# Patient Record
Sex: Female | Born: 1970
Health system: Southern US, Community
[De-identification: ages and names within clinical notes are randomized; demographics above are authoritative.]

## PROBLEM LIST (undated history)

## (undated) DIAGNOSIS — J45909 Unspecified asthma, uncomplicated: Secondary | ICD-10-CM

## (undated) DIAGNOSIS — F419 Anxiety disorder, unspecified: Secondary | ICD-10-CM

## (undated) DIAGNOSIS — F32A Depression, unspecified: Secondary | ICD-10-CM

## (undated) DIAGNOSIS — E559 Vitamin D deficiency, unspecified: Secondary | ICD-10-CM

## (undated) HISTORY — DX: Unspecified asthma, uncomplicated: J45.909

## (undated) HISTORY — PX: COLONOSCOPY: SHX174

## (undated) HISTORY — PX: NO PAST SURGERIES: SHX2092

## (undated) HISTORY — DX: Vitamin D deficiency, unspecified: E55.9

## (undated) HISTORY — DX: Depression, unspecified: F32.A

## (undated) HISTORY — DX: Anxiety disorder, unspecified: F41.9

---

## 1999-01-17 ENCOUNTER — Other Ambulatory Visit: Admission: RE | Admit: 1999-01-17 | Discharge: 1999-01-17 | Payer: Self-pay | Admitting: Obstetrics and Gynecology

## 1999-12-04 ENCOUNTER — Inpatient Hospital Stay (HOSPITAL_COMMUNITY): Admission: AD | Admit: 1999-12-04 | Discharge: 1999-12-06 | Payer: Self-pay | Admitting: Obstetrics and Gynecology

## 1999-12-11 ENCOUNTER — Encounter: Admission: RE | Admit: 1999-12-11 | Discharge: 2000-03-10 | Payer: Self-pay | Admitting: Obstetrics and Gynecology

## 2000-01-17 ENCOUNTER — Other Ambulatory Visit: Admission: RE | Admit: 2000-01-17 | Discharge: 2000-01-17 | Payer: Self-pay | Admitting: Obstetrics and Gynecology

## 2001-03-20 ENCOUNTER — Other Ambulatory Visit: Admission: RE | Admit: 2001-03-20 | Discharge: 2001-03-20 | Payer: Self-pay | Admitting: Obstetrics and Gynecology

## 2002-05-11 ENCOUNTER — Other Ambulatory Visit: Admission: RE | Admit: 2002-05-11 | Discharge: 2002-05-11 | Payer: Self-pay | Admitting: Obstetrics and Gynecology

## 2003-06-22 ENCOUNTER — Other Ambulatory Visit: Admission: RE | Admit: 2003-06-22 | Discharge: 2003-06-22 | Payer: Self-pay | Admitting: Obstetrics and Gynecology

## 2004-11-23 ENCOUNTER — Other Ambulatory Visit: Admission: RE | Admit: 2004-11-23 | Discharge: 2004-11-23 | Payer: Self-pay | Admitting: Obstetrics and Gynecology

## 2005-12-11 ENCOUNTER — Other Ambulatory Visit: Admission: RE | Admit: 2005-12-11 | Discharge: 2005-12-11 | Payer: Self-pay | Admitting: Obstetrics and Gynecology

## 2014-09-23 ENCOUNTER — Encounter (HOSPITAL_BASED_OUTPATIENT_CLINIC_OR_DEPARTMENT_OTHER): Payer: Self-pay

## 2014-09-23 ENCOUNTER — Emergency Department (HOSPITAL_BASED_OUTPATIENT_CLINIC_OR_DEPARTMENT_OTHER)
Admission: EM | Admit: 2014-09-23 | Discharge: 2014-09-24 | Disposition: A | Discharge status: Home / Self Care | Payer: BC Managed Care – PPO | Attending: Emergency Medicine | Admitting: Emergency Medicine

## 2014-09-23 DIAGNOSIS — S61112A Laceration without foreign body of left thumb with damage to nail, initial encounter: Secondary | ICD-10-CM | POA: Diagnosis present

## 2014-09-23 DIAGNOSIS — Z79899 Other long term (current) drug therapy: Secondary | ICD-10-CM | POA: Diagnosis not present

## 2014-09-23 DIAGNOSIS — Y9289 Other specified places as the place of occurrence of the external cause: Secondary | ICD-10-CM | POA: Diagnosis not present

## 2014-09-23 DIAGNOSIS — Y998 Other external cause status: Secondary | ICD-10-CM | POA: Diagnosis not present

## 2014-09-23 DIAGNOSIS — W260XXA Contact with knife, initial encounter: Secondary | ICD-10-CM | POA: Insufficient documentation

## 2014-09-23 DIAGNOSIS — Y93G9 Activity, other involving cooking and grilling: Secondary | ICD-10-CM | POA: Diagnosis not present

## 2014-09-23 DIAGNOSIS — IMO0002 Reserved for concepts with insufficient information to code with codable children: Secondary | ICD-10-CM

## 2014-09-23 NOTE — ED Notes (Addendum)
Cut left thumb with kitchen knife approx 1pm-lac noted along nail bed with bleeding-pt with bandaids and given gauze

## 2014-09-24 ENCOUNTER — Encounter (HOSPITAL_BASED_OUTPATIENT_CLINIC_OR_DEPARTMENT_OTHER): Payer: Self-pay | Admitting: Emergency Medicine

## 2014-09-24 MED ORDER — IBUPROFEN 800 MG PO TABS
800.0000 mg | ORAL_TABLET | Freq: Once | ORAL | Status: AC
  Administered 2014-09-24: 800 mg via ORAL
  Filled 2014-09-24: qty 1

## 2014-09-24 NOTE — ED Provider Notes (Signed)
CSN: [id number]     Arrival date & time 09/23/14  2159 History   First MD Initiated Contact with Patient 09/24/14 0029     Chief Complaint  Patient presents with  . Extremity Laceration     (Consider location/radiation/quality/duration/timing/severity/associated sxs/prior Treatment) Patient is a 43 y.o. female presenting with skin laceration. The history is provided by the patient.  Laceration Location:  Hand Hand laceration location:  L finger Depth:  Cutaneous Quality: jagged   Bleeding: venous and controlled with pressure   Time since incident:  12 hours Laceration mechanism:  Knife Pain details:    Quality:  Aching   Severity:  Moderate   Timing:  Constant   Progression:  Unchanged Foreign body present:  No foreign bodies Relieved by:  Nothing Worsened by:  Nothing tried Ineffective treatments:  None tried Tetanus status:  Up to date   History reviewed. No pertinent past medical history. History reviewed. No pertinent past surgical history. No family history on file. History  Substance Use Topics  . Smoking status: Never Smoker   . Smokeless tobacco: Not on file  . Alcohol Use: No   OB History    No data available     Review of Systems  Skin: Positive for wound.  All other systems reviewed and are negative.     Allergies  Review of patient's allergies indicates no known allergies.  Home Medications   Prior to Admission medications   Medication Sig Start Date End Date Taking? Authorizing Provider  DULoxetine (CYMBALTA) 60 MG capsule Take 60 mg by mouth daily.   Yes Historical Provider, MD   BP 136/86 mmHg  Pulse 76  Temp(Src) 98.9 F (37.2 C) (Oral)  Resp 18  Ht 5\' 6"  (1.676 m)  Wt 190 lb (86.183 kg)  BMI 30.68 kg/m2  SpO2 97%  LMP 09/12/2014 Physical Exam  Constitutional: She is oriented to person, place, and time. She appears well-developed and well-nourished. No distress.  HENT:  Head: Normocephalic and atraumatic.  Eyes: Conjunctivae  and EOM are normal.  Neck: Normal range of motion. Neck supple.  Cardiovascular: Normal rate, regular rhythm and intact distal pulses.   Pulmonary/Chest: Effort normal and breath sounds normal. She has no wheezes.  Abdominal: Soft. Bowel sounds are normal. There is no tenderness.  Musculoskeletal: Normal range of motion.       Left hand: She exhibits normal range of motion, no tenderness, no bony tenderness and normal two-point discrimination. Normal sensation noted. Normal strength noted.       Hands: Neurological: She is alert and oriented to person, place, and time.  Skin: Skin is warm and dry.  Psychiatric: She has a normal mood and affect.    ED Course  Procedures (including critical care time) Labs Review Labs Reviewed - No data to display  Imaging Review No results found.   EKG Interpretation None      MDM   Final diagnoses:  None    LACERATION REPAIR Performed by: Carlisle Beers Authorized by: Carlisle Beers Consent: Verbal consent obtained. Risks and benefits: risks, benefits and alternatives were discussed Consent given by: patient Patient identity confirmed: provided demographic data Prepped and Draped in normal sterile fashion Wound explored  Laceration Location: thumb left at border of nail  Laceration Length:  .9 cm  No Foreign Bodies seen or palpated  Irrigation method: syringe Amount of cleaning: extensive  Skin closure: dermabond   Technique: dermabond  Patient tolerance: Patient tolerated the procedure well with no immediate complications.  Carlisle Beers, MD 09/24/14 [phone]

## 2014-09-24 NOTE — ED Notes (Signed)
Wound naturally approximated well, not displaced, dermabond applied by Dr. Randal Buba. No oozing, bleeding or drainage. Dressed with gauze. Denies questions sx or needs. Out with family.

## 2014-09-24 NOTE — ED Notes (Addendum)
Pt seen by EDP prior to RN assessment, see MD notes, orders received and initiated, pt soaking thumb in betadine. Here for "cut thumb with knife while cutting potatoes". Bleeding controlled. Denies pain. CMS/ROM intact. Td " w/in 5 yrs, 0.5cm lac note to thumb at nail edge.

## 2015-12-12 DIAGNOSIS — Z1231 Encounter for screening mammogram for malignant neoplasm of breast: Secondary | ICD-10-CM | POA: Diagnosis not present

## 2015-12-15 MED FILL — DULoxetine HCL 60 MG CPEP: 60 | 90 days supply | Qty: 90 | Fill #2

## 2016-01-30 DIAGNOSIS — Z01419 Encounter for gynecological examination (general) (routine) without abnormal findings: Secondary | ICD-10-CM | POA: Diagnosis not present

## 2016-01-30 DIAGNOSIS — Z6832 Body mass index (BMI) 32.0-32.9, adult: Secondary | ICD-10-CM | POA: Diagnosis not present

## 2016-03-13 MED FILL — DULoxetine HCL 60 MG CPEP: 60 | 90 days supply | Qty: 90 | Fill #3

## 2016-06-12 MED FILL — DULoxetine HCL 60 MG CPEP: 60 | 60 days supply | Qty: 60 | Fill #4

## 2016-06-26 DIAGNOSIS — Z0000 Encounter for general adult medical examination without abnormal findings: Secondary | ICD-10-CM | POA: Diagnosis not present

## 2016-06-26 DIAGNOSIS — E559 Vitamin D deficiency, unspecified: Secondary | ICD-10-CM | POA: Diagnosis not present

## 2016-06-26 DIAGNOSIS — Z1389 Encounter for screening for other disorder: Secondary | ICD-10-CM | POA: Diagnosis not present

## 2016-06-26 DIAGNOSIS — F411 Generalized anxiety disorder: Secondary | ICD-10-CM | POA: Diagnosis not present

## 2016-06-26 DIAGNOSIS — R7309 Other abnormal glucose: Secondary | ICD-10-CM | POA: Diagnosis not present

## 2016-07-18 ENCOUNTER — Emergency Department (HOSPITAL_COMMUNITY)
Admission: EM | Admit: 2016-07-18 | Discharge: 2016-07-18 | Disposition: A | Discharge status: Home / Self Care | Payer: 59 | Attending: Emergency Medicine | Admitting: Emergency Medicine

## 2016-07-18 ENCOUNTER — Encounter (HOSPITAL_COMMUNITY): Payer: Self-pay | Admitting: *Deleted

## 2016-07-18 ENCOUNTER — Emergency Department (HOSPITAL_COMMUNITY): Payer: 59

## 2016-07-18 DIAGNOSIS — Z79899 Other long term (current) drug therapy: Secondary | ICD-10-CM | POA: Insufficient documentation

## 2016-07-18 DIAGNOSIS — R1084 Generalized abdominal pain: Secondary | ICD-10-CM | POA: Diagnosis not present

## 2016-07-18 DIAGNOSIS — R110 Nausea: Secondary | ICD-10-CM | POA: Insufficient documentation

## 2016-07-18 DIAGNOSIS — R109 Unspecified abdominal pain: Secondary | ICD-10-CM | POA: Diagnosis not present

## 2016-07-18 DIAGNOSIS — Z791 Long term (current) use of non-steroidal anti-inflammatories (NSAID): Secondary | ICD-10-CM | POA: Diagnosis not present

## 2016-07-18 DIAGNOSIS — R102 Pelvic and perineal pain: Secondary | ICD-10-CM | POA: Diagnosis not present

## 2016-07-18 LAB — CBC WITH DIFFERENTIAL/PLATELET
Basophils Absolute: 0 10*3/uL (ref 0.0–0.1)
Basophils Relative: 0 %
Eosinophils Absolute: 0 10*3/uL (ref 0.0–0.7)
Eosinophils Relative: 0 %
HCT: 37.5 % (ref 36.0–46.0)
Hemoglobin: 12.8 g/dL (ref 12.0–15.0)
Lymphocytes Relative: 11 %
Lymphs Abs: 1 10*3/uL (ref 0.7–4.0)
MCH: 30.9 pg (ref 26.0–34.0)
MCHC: 34.1 g/dL (ref 30.0–36.0)
MCV: 90.6 fL (ref 78.0–100.0)
Monocytes Absolute: 0.6 10*3/uL (ref 0.1–1.0)
Monocytes Relative: 7 %
Neutro Abs: 7.5 10*3/uL (ref 1.7–7.7)
Neutrophils Relative %: 82 %
Platelets: 300 10*3/uL (ref 150–400)
RBC: 4.14 MIL/uL (ref 3.87–5.11)
RDW: 13.5 % (ref 11.5–15.5)
WBC: 9.2 10*3/uL (ref 4.0–10.5)

## 2016-07-18 LAB — COMPREHENSIVE METABOLIC PANEL
ALT: 18 U/L (ref 14–54)
AST: 21 U/L (ref 15–41)
Albumin: 4.3 g/dL (ref 3.5–5.0)
Alkaline Phosphatase: 43 U/L (ref 38–126)
Anion gap: 8 (ref 5–15)
BUN: 19 mg/dL (ref 6–20)
CO2: 20 mmol/L — ABNORMAL LOW (ref 22–32)
Calcium: 9.2 mg/dL (ref 8.9–10.3)
Chloride: 106 mmol/L (ref 101–111)
Creatinine, Ser: 0.78 mg/dL (ref 0.44–1.00)
GFR calc Af Amer: >60 mL/min (ref >60)
GFR calc non Af Amer: >60 mL/min (ref >60)
Glucose, Bld: 111 mg/dL — ABNORMAL HIGH (ref 65–99)
Potassium: 3.6 mmol/L (ref 3.5–5.1)
Sodium: 134 mmol/L — ABNORMAL LOW (ref 135–145)
Total Bilirubin: 0.3 mg/dL (ref 0.3–1.2)
Total Protein: 7 g/dL (ref 6.5–8.1)

## 2016-07-18 LAB — URINALYSIS, ROUTINE W REFLEX MICROSCOPIC
Bilirubin Urine: NEGATIVE
Glucose, UA: NEGATIVE mg/dL
Hgb urine dipstick: NEGATIVE
Ketones, ur: NEGATIVE mg/dL
Leukocytes, UA: NEGATIVE
Nitrite: NEGATIVE
Protein, ur: NEGATIVE mg/dL
Specific Gravity, Urine: 1.018 (ref 1.005–1.030)
pH: 8.5 — ABNORMAL HIGH (ref 5.0–8.0)

## 2016-07-18 LAB — LIPASE, BLOOD: Lipase: 30 U/L (ref 11–51)

## 2016-07-18 LAB — I-STAT CG4 LACTIC ACID, ED: Lactic Acid, Venous: 2.02 mmol/L (ref 0.5–1.9)

## 2016-07-18 LAB — POC URINE PREG, ED: Preg Test, Ur: NEGATIVE

## 2016-07-18 MED ORDER — SODIUM CHLORIDE 0.9 % IV BOLUS (SEPSIS)
1000.0000 mL | Freq: Once | INTRAVENOUS | Status: AC
Start: 2016-07-18 — End: 2016-07-18
  Administered 2016-07-18: 1000 mL via INTRAVENOUS

## 2016-07-18 MED ORDER — ONDANSETRON HCL 4 MG PO TABS: 4.0000 mg | ORAL_TABLET | Freq: Four times a day (QID) | ORAL | 0 refills | Status: DC | PRN

## 2016-07-18 MED ORDER — MORPHINE SULFATE (PF) 2 MG/ML IV SOLN
2.0000 mg | Freq: Once | INTRAVENOUS | Status: DC
  Filled 2016-07-18: qty 1

## 2016-07-18 MED ORDER — HYDROCODONE-ACETAMINOPHEN 5-325 MG PO TABS: 1.0000 | ORAL_TABLET | ORAL | 0 refills | Status: DC | PRN

## 2016-07-18 MED ORDER — IOPAMIDOL (ISOVUE-300) INJECTION 61%
100.0000 mL | Freq: Once | INTRAVENOUS | Status: AC | PRN
  Administered 2016-07-18: 100 mL via INTRAVENOUS

## 2016-07-18 MED ORDER — IOPAMIDOL (ISOVUE-300) INJECTION 61%
30.0000 mL | Freq: Once | INTRAVENOUS | Status: AC | PRN
  Administered 2016-07-18: 30 mL via ORAL

## 2016-07-18 MED ORDER — ONDANSETRON HCL 4 MG/2ML IJ SOLN
4.0000 mg | Freq: Once | INTRAMUSCULAR | Status: DC
  Filled 2016-07-18: qty 2

## 2016-07-18 NOTE — ED Notes (Signed)
Lawerance Bach, PA-C made aware of patient lactic result.

## 2016-07-18 NOTE — ED Triage Notes (Signed)
Pt reports sudden onset of severe abd pain this am around 0800.  Denies any n/v.  Reports severity of the pain comes in waves.   Has this "twisting" feeling.

## 2016-07-18 NOTE — ED Provider Notes (Signed)
[facility] DEPT Provider Note   CSN: [id number] Arrival date & time: 07/18/16  1020     History   Chief Complaint Chief Complaint  Patient presents with  . Abdominal Pain    HPI Annebelle Fichter is a 45 y.o. 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 Patient reports that suddenly, at 8 AM, patient had 10 out of 10 abdominal pain in her epigastrium. Patient says she's never had this pain before and it caused her to leave work. Patient reported some mild nausea but no vomiting. Patient reported having a normal bowel movement today with no bleeding. Patient denies fevers, chills, chest pain, shortness of breath, cough, constipation, diarrhea, dysuria. Patient denies any recent abdominal trauma. Patient denies any allergies to medications. Patient said the pain radiated across her abdomen and it felt worse when she moves. She denies any history of kidney stones, gallstones, and any history with her liver or pancreas.   The history is provided by the patient and medical records. No language interpreter was used.  Abdominal Pain   This is a new problem. The current episode started 3 to 5 hours ago. The problem occurs constantly. The problem has been gradually improving. The pain is located in the generalized abdominal region. The quality of the pain is cramping. The pain is at a severity of 10/10. The pain is severe. Associated symptoms include nausea. Pertinent negatives include fever, diarrhea, melena, vomiting, constipation, dysuria, frequency, hematuria and headaches. The symptoms are aggravated by palpation and certain positions. Nothing relieves the symptoms.    History reviewed. No pertinent past medical history.  There are no active problems to display for this patient.   History reviewed. No pertinent surgical history.  OB History    No data available       Home Medications    Prior to Admission  medications   Medication Sig Start Date End Date Taking? Authorizing Provider  DULoxetine (CYMBALTA) 60 MG capsule Take 60 mg by mouth daily.   Yes Historical Provider, MD  ibuprofen (ADVIL,MOTRIN) 200 MG tablet Take 400 mg by mouth every 6 (six) hours as needed for moderate pain.   Yes Historical Provider, MD  loratadine (CLARITIN) 10 MG tablet Take 10 mg by mouth daily as needed for allergies.   Yes Historical Provider, MD    Family History No family history on file.  Social History Social History  Substance Use Topics  . Smoking status: Never Smoker  . Smokeless tobacco: Never Used  . Alcohol use No     Allergies   Review of patient's allergies indicates no known allergies.   Review of Systems Review of Systems  Constitutional: Positive for diaphoresis. Negative for chills, fatigue and fever.  HENT: Negative for congestion and rhinorrhea.   Eyes: Negative for visual disturbance.  Respiratory: Negative for cough, chest tightness, shortness of breath, wheezing and stridor.   Cardiovascular: Negative for chest pain and palpitations.  Gastrointestinal: Positive for abdominal pain and nausea. Negative for constipation, diarrhea, melena and vomiting.  Genitourinary: Negative for decreased urine volume, dysuria, flank pain, frequency, hematuria, pelvic pain, vaginal bleeding, vaginal discharge and vaginal pain.  Musculoskeletal: Negative for back pain, neck pain and neck stiffness.  Skin: Negative for rash and wound.  Neurological: Negative for dizziness, weakness, light-headedness, numbness and headaches.  All other systems reviewed and are negative.    Physical Exam Updated Vital Signs BP 151/86 (BP Location: Right Arm)   Pulse  89   Temp 97.8 F (36.6 C) (Oral)   Resp 18   Ht 5\' 6"  (1.676 m)   Wt 163 lb (73.9 kg)   LMP 06/22/2016   SpO2 100%   BMI 26.31 kg/m   Physical Exam  Constitutional: She appears well-developed and well-nourished. No distress.  HENT:  Head:  Normocephalic and atraumatic.  Mouth/Throat: Oropharynx is clear and moist. No oropharyngeal exudate.  Eyes: Conjunctivae and EOM are normal. Pupils are equal, round, and reactive to light.  Neck: Normal range of motion. Neck supple.  Cardiovascular: Normal rate and regular rhythm.   No murmur heard. Pulmonary/Chest: Effort normal and breath sounds normal. No stridor. No respiratory distress. She exhibits no tenderness.  Abdominal: Soft. She exhibits no distension. There is tenderness. There is no rebound and no guarding.  Musculoskeletal: She exhibits no edema or tenderness.  Neurological: She is alert.  Skin: Skin is warm and dry. Capillary refill takes less than 2 seconds.  Psychiatric: She has a normal mood and affect.  Nursing note and vitals reviewed.    ED Treatments / Results  Labs (all labs ordered are listed, but only abnormal results are displayed) Labs Reviewed  COMPREHENSIVE METABOLIC PANEL - Abnormal; Notable for the following:       Result Value   Sodium 134 (*)    CO2 20 (*)    Glucose, Bld 111 (*)    All other components within normal limits  URINALYSIS, ROUTINE W REFLEX MICROSCOPIC (NOT AT [facility]) - Abnormal; Notable for the following:    APPearance CLOUDY (*)    pH 8.5 (*)    All other components within normal limits  I-STAT CG4 LACTIC ACID, ED - Abnormal; Notable for the following:    Lactic Acid, Venous 2.02 (*)    All other components within normal limits  CBC WITH DIFFERENTIAL/PLATELET  LIPASE, BLOOD  POC URINE PREG, ED    EKG  EKG Interpretation None       Radiology Ct Abdomen Pelvis W Contrast  Result Date: 07/18/2016 CLINICAL DATA:  Acute onset severe abdominal pain at 8 a.m. this morning. EXAM: CT ABDOMEN AND PELVIS WITH CONTRAST TECHNIQUE: Multidetector CT imaging of the abdomen and pelvis was performed using the standard protocol following bolus administration of intravenous contrast. CONTRAST:  100 ml ISOVUE-300 IOPAMIDOL (ISOVUE-300)  INJECTION 61% COMPARISON:  None. FINDINGS: Lower chest: Heart size is normal. No pleural or pericardial effusion. Lung bases are clear. Hepatobiliary: Appears normal. Pancreas: Appears normal. Spleen: Appears normal. Adrenals/Urinary Tract: Appear normal. Stomach/Bowel: Unremarkable.  The appendix appears normal. Vascular/Lymphatic: Single small aortic atherosclerotic calcification is identified. Reproductive: An irregular rim enhancing structure in the in the right ovary is compatible with an involuting cyst measuring 0.7 x 1.7 x 1.6 cm. Associated small volume of free pelvic fluid is noted. Other: None. Musculoskeletal: Unremarkable. IMPRESSION: Findings consistent with a ruptured small right ovarian cyst with associated small volume of free pelvic fluid. The exam is otherwise negative. Electronically Signed   By: Inge Rise M.D.   On: 07/18/2016 13:10    Procedures Procedures (including critical care time)  Medications Ordered in ED Medications  sodium chloride 0.9 % bolus 1,000 mL (0 mLs Intravenous Stopped 07/18/16 1421)  iopamidol (ISOVUE-300) 61 % injection 30 mL (30 mLs Oral Contrast Given 07/18/16 1244)  iopamidol (ISOVUE-300) 61 % injection 100 mL (100 mLs Intravenous Contrast Given 07/18/16 1243)     Initial Impression / Assessment and Plan / ED Course  I have reviewed the triage  vital signs and the nursing notes.  Pertinent labs & imaging results that were available during my care of the patient were reviewed by me and considered in my medical decision making (see chart for details).  Clinical Course    Qadirah Lindgren is a 45 y.o. 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 Exam and history are seen above. Given significant pain, patient had pain medication, fluids, and also medicine ordered. Patient had laboratory and imaging testing ordered to evaluate for Surgical or infectious etiology of pain.  Results  of workup are seen above. Patient had Slightly elevated lactic acid of 2.02. Fluids were given. Pregnancy test negative. Urinalysis showed no hematuria, doubt nephrolithiasis. No evidence of infection on urinalysis. Lipase Nonelevated, doubt pancreatitis. CMP showed normal LFT and normal kidney function. Electrolytes were not significantly abnormal. CBC showed no Evidence of anemia or leukocytosis.  CT scan showed evidence of A ruptured small right ovarian cyst with small amount of free pelvic fluid. No other significant findings found.  Given description of patients abdominal pain being diffuse and sudden, suspect ruptured cyst fluid caused peritoneal irritation and subsequent pain.  Patient ended up not Requiring pain medication while in ED. Given the significant pain, patient was prescribed pain medication and nausea medicine a discharge. Patient starts to follow up with both PCP and GYN team for further management. Patient given strict return precautions for any new or worsening symptoms and patient understood the plan. Patient discharged in good condition.   Final Clinical Impressions(s) / ED Diagnoses   Final diagnoses:  Generalized abdominal pain    New Prescriptions Discharge Medication List as of 07/18/2016  2:16 PM    START taking these medications   Details  HYDROcodone-acetaminophen (NORCO/VICODIN) 5-325 MG tablet Take 1 tablet by mouth every 4 (four) hours as needed., Starting Wed 07/18/2016, Print    ondansetron (ZOFRAN) 4 MG tablet Take 1 tablet (4 mg total) by mouth every 6 (six) hours as needed for nausea or vomiting., Starting Wed 07/18/2016, Print         Clinical Impression: 1. Generalized abdominal pain     Disposition: Discharge  Condition: Good  I have discussed the results, Dx and Tx plan with the pt(& family if present). He/she/they expressed understanding and agree(s) with the plan. Discharge instructions discussed at great length. Strict return precautions  discussed and pt &/or family have verbalized understanding of the instructions. No further questions at time of discharge.    Discharge Medication List as of 07/18/2016  2:16 PM    START taking these medications   Details  HYDROcodone-acetaminophen (NORCO/VICODIN) 5-325 MG tablet Take 1 tablet by mouth every 4 (four) hours as needed., Starting Wed 07/18/2016, Print    ondansetron (ZOFRAN) 4 MG tablet Take 1 tablet (4 mg total) by mouth every 6 (six) hours as needed for nausea or vomiting., Starting Wed 07/18/2016, Print        Follow Up: Your GYN team and your PCP.  Schedule an appointment as soon as possible for a visit  If symptoms worsen, please return to the nearest ED.     Gwenyth Allegra Josaiah Muhammed, MD 07/18/16 [phone]

## 2016-07-20 MED FILL — HYDROCODON-APAP 5-325: 5-325 | 2 days supply | Qty: 10 | Fill #0

## 2016-07-20 MED FILL — ONDANSETRON HCL 4 MG TABLET: 4 | 3 days supply | Qty: 12 | Fill #0

## 2016-07-30 ENCOUNTER — Encounter (HOSPITAL_COMMUNITY): Payer: Self-pay | Admitting: *Deleted

## 2016-07-30 ENCOUNTER — Ambulatory Visit (HOSPITAL_COMMUNITY)
Admission: EM | Admit: 2016-07-30 | Discharge: 2016-07-30 | Disposition: A | Discharge status: Home / Self Care | Payer: 59 | Attending: Internal Medicine | Admitting: Internal Medicine

## 2016-07-30 DIAGNOSIS — W57XXXA Bitten or stung by nonvenomous insect and other nonvenomous arthropods, initial encounter: Secondary | ICD-10-CM

## 2016-07-30 DIAGNOSIS — T148XXA Other injury of unspecified body region, initial encounter: Secondary | ICD-10-CM

## 2016-07-30 MED ORDER — PREDNISONE 50 MG PO TABS: 50.0000 mg | ORAL_TABLET | Freq: Every day | ORAL | 0 refills | Status: AC

## 2016-07-30 NOTE — ED Triage Notes (Signed)
Patient reports she was on vacation last week, at the beach, and noticed small raised bites, that itch, to bilateral arms. No other bites are noted on patients body.

## 2016-07-30 NOTE — Discharge Instructions (Addendum)
Itchy spots seem most likely to be insect bites, but could represent a contact reaction to a plant or other substance. Prescription for prednisone sent to the [facility] in [place].  Recheck or followup PCP/Dr Lysle Rubens for further evaluation if symptoms persist.

## 2016-07-30 NOTE — ED Provider Notes (Signed)
[facility]    CSN: [id number] Arrival date & time: 07/30/16  1844     History   Chief Complaint Chief Complaint  Patient presents with  . Insect Bite    HPI Melissa Weber is a 45 y.o. female. She presents today with itchy red bumps in streaks over her arms, torso. She has just returned from a trip to the beach, and is a little bit worried about insect bites. No fever, no malaise.  HPI  History reviewed. No pertinent past medical history.  History reviewed. No pertinent surgical history.   Home Medications    Prior to Admission medications   Medication Sig Start Date End Date Taking? Authorizing Provider  DULoxetine (CYMBALTA) 60 MG capsule Take 60 mg by mouth daily.   Yes Historical Provider, MD  HYDROcodone-acetaminophen (NORCO/VICODIN) 5-325 MG tablet Take 1 tablet by mouth every 4 (four) hours as needed. 07/18/16   Gwenyth Allegra Tegeler, MD  ibuprofen (ADVIL,MOTRIN) 200 MG tablet Take 400 mg by mouth every 6 (six) hours as needed for moderate pain.    Historical Provider, MD  loratadine (CLARITIN) 10 MG tablet Take 10 mg by mouth daily as needed for allergies.    Historical Provider, MD  ondansetron (ZOFRAN) 4 MG tablet Take 1 tablet (4 mg total) by mouth every 6 (six) hours as needed for nausea or vomiting. 07/18/16   Gwenyth Allegra Tegeler, MD  predniSONE (DELTASONE) 50 MG tablet Take 1 tablet (50 mg total) by mouth daily. 07/30/16 08/04/16  Sherlene Shams, MD    Family History History reviewed. No pertinent family history.  Social History Social History  Substance Use Topics  . Smoking status: Never Smoker  . Smokeless tobacco: Never Used  . Alcohol use No     Allergies   Review of patient's allergies indicates no known allergies.   Review of Systems Review of Systems  All other systems reviewed and are negative.    Physical Exam Triage Vital Signs ED Triage Vitals [07/30/16 1949]  Enc Vitals Group     BP 113/63     Pulse Rate 69     Resp  12     Temp 98.2 F (36.8 C)     Temp Source Oral     SpO2 100 %     Weight      Height    Updated Vital Signs BP 113/63 (BP Location: Left Arm)   Pulse 69   Temp 98.2 F (36.8 C) (Oral)   Resp 12   LMP 07/24/2016   SpO2 100%  Physical Exam  Constitutional: She is oriented to person, place, and time. No distress.  Alert, nicely groomed  HENT:  Head: Atraumatic.  Eyes:  Conjugate gaze, no eye redness/drainage  Neck: Neck supple.  Cardiovascular: Normal rate.   Pulmonary/Chest: No respiratory distress.  Abdominal: She exhibits no distension.  Musculoskeletal: Normal range of motion.  No leg swelling  Neurological: She is alert and oriented to person, place, and time.  Skin: Skin is warm and dry.  No cyanosis Red papules and streaks and patches, many with a tiny vesicle on top, very itchy, over the arms and torso.  Nursing note and vitals reviewed.    UC Treatments / Results   Procedures Procedures (including critical care time)      None today   Final Clinical Impressions(s) / UC Diagnoses   Final diagnoses:  Insect bite, initial encounter  Itchy spots seem most likely to be insect bites, but could represent  a contact reaction to a plant or other substance. [facility] [place].  Recheck or followup PCP/Dr Lysle Rubens for further evaluation if symptoms persist.     New Prescriptions New Prescriptions   PREDNISONE (DELTASONE) 50 MG TABLET    Take 1 tablet (50 mg total) by mouth daily.     Sherlene Shams, MD 07/31/16 [phone]

## 2016-08-09 MED FILL — DULoxetine HCL 60 MG CPEP: 60 | 90 days supply | Qty: 90 | Fill #0

## 2016-10-08 DIAGNOSIS — D1801 Hemangioma of skin and subcutaneous tissue: Secondary | ICD-10-CM | POA: Diagnosis not present

## 2016-10-08 DIAGNOSIS — I8392 Asymptomatic varicose veins of left lower extremity: Secondary | ICD-10-CM | POA: Diagnosis not present

## 2016-10-08 DIAGNOSIS — D485 Neoplasm of uncertain behavior of skin: Secondary | ICD-10-CM | POA: Diagnosis not present

## 2016-11-07 MED FILL — DULoxetine HCL 60 MG CPEP: 60 | 90 days supply | Qty: 90 | Fill #1

## 2017-01-21 DIAGNOSIS — J9801 Acute bronchospasm: Secondary | ICD-10-CM | POA: Diagnosis not present

## 2017-01-21 DIAGNOSIS — R05 Cough: Secondary | ICD-10-CM | POA: Diagnosis not present

## 2017-01-21 DIAGNOSIS — J45909 Unspecified asthma, uncomplicated: Secondary | ICD-10-CM | POA: Diagnosis not present

## 2017-01-21 MED FILL — VENTOLIN HFA 90 MCG INHALER: 108 (90 BAS | 25 days supply | Qty: 18 | Fill #0

## 2017-01-21 MED FILL — AZITHROMYCIN 250 MG TABLET: 250 | 5 days supply | Qty: 6 | Fill #0

## 2017-01-21 MED FILL — predniSONE 20 MG TABS: 20 | 9 days supply | Qty: 18 | Fill #0

## 2017-01-24 DIAGNOSIS — H5203 Hypermetropia, bilateral: Secondary | ICD-10-CM | POA: Diagnosis not present

## 2017-01-24 DIAGNOSIS — H524 Presbyopia: Secondary | ICD-10-CM | POA: Diagnosis not present

## 2017-02-08 MED FILL — DULoxetine HCL 60 MG CPEP: 60 | 90 days supply | Qty: 90 | Fill #2 | Status: TO

## 2017-04-08 DIAGNOSIS — Z01419 Encounter for gynecological examination (general) (routine) without abnormal findings: Secondary | ICD-10-CM | POA: Diagnosis not present

## 2017-04-08 DIAGNOSIS — Z6830 Body mass index (BMI) 30.0-30.9, adult: Secondary | ICD-10-CM | POA: Diagnosis not present

## 2017-04-08 DIAGNOSIS — Z1231 Encounter for screening mammogram for malignant neoplasm of breast: Secondary | ICD-10-CM | POA: Diagnosis not present

## 2017-05-09 MED FILL — DULoxetine HCL 60 MG CPEP: 60 | 90 days supply | Qty: 90 | Fill #0

## 2017-07-04 DIAGNOSIS — Z1389 Encounter for screening for other disorder: Secondary | ICD-10-CM | POA: Diagnosis not present

## 2017-07-04 DIAGNOSIS — E559 Vitamin D deficiency, unspecified: Secondary | ICD-10-CM | POA: Diagnosis not present

## 2017-07-04 DIAGNOSIS — R7301 Impaired fasting glucose: Secondary | ICD-10-CM | POA: Diagnosis not present

## 2017-07-04 DIAGNOSIS — F411 Generalized anxiety disorder: Secondary | ICD-10-CM | POA: Diagnosis not present

## 2017-07-04 DIAGNOSIS — Z0000 Encounter for general adult medical examination without abnormal findings: Secondary | ICD-10-CM | POA: Diagnosis not present

## 2017-08-07 MED FILL — DULoxetine HCL 60 MG CPEP: 60 | 90 days supply | Qty: 90 | Fill #0

## 2017-11-06 MED FILL — DULoxetine HCL 60 MG CPEP: 60 | 90 days supply | Qty: 90 | Fill #1

## 2018-02-05 MED FILL — DULoxetine HCL 60 MG CPEP: 60 | 90 days supply | Qty: 90 | Fill #2

## 2018-04-09 DIAGNOSIS — H524 Presbyopia: Secondary | ICD-10-CM | POA: Diagnosis not present

## 2018-04-09 DIAGNOSIS — H5203 Hypermetropia, bilateral: Secondary | ICD-10-CM | POA: Diagnosis not present

## 2018-05-06 MED FILL — DULoxetine HCL 60 MG CPEP: 60 | 90 days supply | Qty: 90 | Fill #3

## 2018-05-26 DIAGNOSIS — Z01419 Encounter for gynecological examination (general) (routine) without abnormal findings: Secondary | ICD-10-CM | POA: Diagnosis not present

## 2018-05-26 DIAGNOSIS — Z1231 Encounter for screening mammogram for malignant neoplasm of breast: Secondary | ICD-10-CM | POA: Diagnosis not present

## 2018-05-26 DIAGNOSIS — Z6833 Body mass index (BMI) 33.0-33.9, adult: Secondary | ICD-10-CM | POA: Diagnosis not present

## 2018-07-11 DIAGNOSIS — E559 Vitamin D deficiency, unspecified: Secondary | ICD-10-CM | POA: Diagnosis not present

## 2018-07-11 DIAGNOSIS — F411 Generalized anxiety disorder: Secondary | ICD-10-CM | POA: Diagnosis not present

## 2018-07-11 DIAGNOSIS — Z136 Encounter for screening for cardiovascular disorders: Secondary | ICD-10-CM | POA: Diagnosis not present

## 2018-07-11 DIAGNOSIS — Z0000 Encounter for general adult medical examination without abnormal findings: Secondary | ICD-10-CM | POA: Diagnosis not present

## 2018-07-11 DIAGNOSIS — Z1389 Encounter for screening for other disorder: Secondary | ICD-10-CM | POA: Diagnosis not present

## 2018-07-17 MED FILL — DULoxetine HCL 60 MG CPEP: 60 | 90 days supply | Qty: 90 | Fill #0

## 2018-11-04 MED FILL — DULoxetine HCL 60 MG CPEP: 60 | 90 days supply | Qty: 90 | Fill #1

## 2018-11-28 ENCOUNTER — Inpatient Hospital Stay: Payer: BC Managed Care – PPO

## 2019-01-26 MED FILL — DULOXETINE HCL 60 MG CPEP: 60 | 90 days supply | Qty: 90 | Fill #2

## 2019-05-06 MED FILL — DULOXETINE HCL 60 MG CPEP: 60 | 90 days supply | Qty: 90 | Fill #3

## 2019-07-20 ENCOUNTER — Other Ambulatory Visit: Payer: Self-pay | Admitting: Internal Medicine

## 2019-07-20 DIAGNOSIS — Z8249 Family history of ischemic heart disease and other diseases of the circulatory system: Secondary | ICD-10-CM

## 2019-08-05 ENCOUNTER — Ambulatory Visit
Admission: RE | Admit: 2019-08-05 | Discharge: 2019-08-05 | Disposition: A | Discharge status: Home / Self Care | Payer: BC Managed Care – PPO | Source: Ambulatory Visit | Attending: Internal Medicine | Admitting: Internal Medicine

## 2019-08-05 DIAGNOSIS — Z8249 Family history of ischemic heart disease and other diseases of the circulatory system: Secondary | ICD-10-CM

## 2019-08-05 MED FILL — DULOXETINE HCL 60 MG CPEP: 60 | 90 days supply | Qty: 90 | Fill #0

## 2019-10-29 MED FILL — ALBUTEROL SULFATE HFA 108 (: 108 (90 BAS | 34 days supply | Qty: 18 | Fill #0

## 2019-10-29 MED FILL — NYSTATIN 100,000 UNITS/ML S: 100000 | 13 days supply | Qty: 200 | Fill #0

## 2019-11-02 MED FILL — DULOXETINE HCL 60 MG CPEP: 60 | 90 days supply | Qty: 90 | Fill #1

## 2020-02-01 MED FILL — DULoxetine HCL 60 MG CPEP: 60 | 90 days supply | Qty: 90 | Fill #2

## 2020-04-26 MED FILL — DULOXETINE HCL 60 MG CPEP: 60 | 90 days supply | Qty: 90 | Fill #3

## 2020-06-21 ENCOUNTER — Other Ambulatory Visit (HOSPITAL_BASED_OUTPATIENT_CLINIC_OR_DEPARTMENT_OTHER): Payer: Self-pay | Admitting: Obstetrics and Gynecology

## 2020-06-21 DIAGNOSIS — Z1231 Encounter for screening mammogram for malignant neoplasm of breast: Secondary | ICD-10-CM

## 2020-06-24 ENCOUNTER — Other Ambulatory Visit: Payer: Self-pay

## 2020-06-24 ENCOUNTER — Ambulatory Visit (HOSPITAL_BASED_OUTPATIENT_CLINIC_OR_DEPARTMENT_OTHER)
Admission: RE | Admit: 2020-06-24 | Discharge: 2020-06-24 | Disposition: A | Discharge status: Home / Self Care | Payer: No Typology Code available for payment source | Source: Ambulatory Visit | Attending: Obstetrics and Gynecology | Admitting: Obstetrics and Gynecology

## 2020-06-24 DIAGNOSIS — Z1231 Encounter for screening mammogram for malignant neoplasm of breast: Secondary | ICD-10-CM | POA: Insufficient documentation

## 2020-07-15 ENCOUNTER — Other Ambulatory Visit (HOSPITAL_BASED_OUTPATIENT_CLINIC_OR_DEPARTMENT_OTHER): Payer: Self-pay | Admitting: Internal Medicine

## 2020-07-15 MED FILL — DULOXETINE HCL 60 MG CPEP: 60 | 90 days supply | Qty: 90 | Fill #0

## 2020-07-19 ENCOUNTER — Other Ambulatory Visit (HOSPITAL_BASED_OUTPATIENT_CLINIC_OR_DEPARTMENT_OTHER): Payer: Self-pay | Admitting: Internal Medicine

## 2020-07-19 MED FILL — VIT D2 1.25 MG (50,000 UNIT: 1.25 MG | 28 days supply | Qty: 4 | Fill #0

## 2020-08-10 ENCOUNTER — Other Ambulatory Visit: Payer: Self-pay | Admitting: Internal Medicine

## 2020-08-10 DIAGNOSIS — R911 Solitary pulmonary nodule: Secondary | ICD-10-CM

## 2020-08-10 MED FILL — VIT D2 1.25 MG (50,000 UNIT: 1.25 MG | 28 days supply | Qty: 4 | Fill #1

## 2020-08-12 ENCOUNTER — Other Ambulatory Visit (HOSPITAL_BASED_OUTPATIENT_CLINIC_OR_DEPARTMENT_OTHER): Payer: Self-pay | Admitting: Internal Medicine

## 2020-08-12 DIAGNOSIS — R911 Solitary pulmonary nodule: Secondary | ICD-10-CM

## 2020-08-19 ENCOUNTER — Other Ambulatory Visit: Payer: Self-pay

## 2020-08-19 ENCOUNTER — Ambulatory Visit (INDEPENDENT_AMBULATORY_CARE_PROVIDER_SITE_OTHER): Payer: No Typology Code available for payment source

## 2020-08-19 DIAGNOSIS — R911 Solitary pulmonary nodule: Secondary | ICD-10-CM

## 2020-08-19 DIAGNOSIS — R918 Other nonspecific abnormal finding of lung field: Secondary | ICD-10-CM | POA: Diagnosis not present

## 2020-08-19 MED ORDER — IOHEXOL 300 MG/ML  SOLN
75.0000 mL | Freq: Once | INTRAMUSCULAR | Status: AC | PRN
  Administered 2020-08-19: 75 mL via INTRAVENOUS

## 2020-09-09 MED FILL — VIT D2 1.25 MG (50,000 UNIT: 1.25 MG | 28 days supply | Qty: 4 | Fill #2

## 2020-09-13 ENCOUNTER — Other Ambulatory Visit (HOSPITAL_BASED_OUTPATIENT_CLINIC_OR_DEPARTMENT_OTHER): Payer: Self-pay | Admitting: Internal Medicine

## 2020-09-13 ENCOUNTER — Ambulatory Visit: Payer: No Typology Code available for payment source | Attending: Internal Medicine

## 2020-09-13 DIAGNOSIS — Z23 Encounter for immunization: Secondary | ICD-10-CM

## 2020-09-13 MED FILL — PFIZER-BIONTECH COVID-19 VA: 30 | 1 days supply | Qty: 0 | Fill #0

## 2020-09-13 NOTE — Progress Notes (Signed)
   Covid-19 Vaccination Clinic  Name:  Melissa Weber    MRN: [id number] DOB: 22-Jun-1971  09/13/2020  [name] was observed post Covid-19 immunization for 15 minutes without incident. She was provided with Vaccine Information Sheet and instruction to access the V-Safe system.   [name] was instructed to call 911 with any severe reactions post vaccine: Marland Kitchen Difficulty breathing  . Swelling of face and throat  . A fast heartbeat  . A bad rash all over body  . Dizziness and weakness   Immunizations Administered    Name Date Dose VIS Date Route   Pfizer COVID-19 Vaccine 09/13/2020  2:46 PM 0.3 mL 08/17/2020 Intramuscular   Manufacturer: [place]   Lot: [id number]   NDC: [address]-4

## 2020-10-13 ENCOUNTER — Other Ambulatory Visit (HOSPITAL_BASED_OUTPATIENT_CLINIC_OR_DEPARTMENT_OTHER): Payer: Self-pay | Admitting: Internal Medicine

## 2020-10-13 MED FILL — VIT D2 1.25 MG (50,000 UNIT: 1.25 MG | 28 days supply | Qty: 4 | Fill #0

## 2020-10-13 MED FILL — DULOXETINE HCL 60 MG CPEP: 60 | 90 days supply | Qty: 90 | Fill #1

## 2020-11-07 ENCOUNTER — Other Ambulatory Visit (HOSPITAL_BASED_OUTPATIENT_CLINIC_OR_DEPARTMENT_OTHER): Payer: Self-pay | Admitting: Obstetrics and Gynecology

## 2020-11-07 DIAGNOSIS — Z780 Asymptomatic menopausal state: Secondary | ICD-10-CM

## 2020-11-08 ENCOUNTER — Other Ambulatory Visit: Payer: Self-pay

## 2020-11-08 ENCOUNTER — Ambulatory Visit (HOSPITAL_BASED_OUTPATIENT_CLINIC_OR_DEPARTMENT_OTHER)
Admission: RE | Admit: 2020-11-08 | Discharge: 2020-11-08 | Disposition: A | Discharge status: Home / Self Care | Payer: No Typology Code available for payment source | Source: Ambulatory Visit | Attending: Obstetrics and Gynecology | Admitting: Obstetrics and Gynecology

## 2020-11-08 DIAGNOSIS — Z780 Asymptomatic menopausal state: Secondary | ICD-10-CM | POA: Insufficient documentation

## 2020-12-30 ENCOUNTER — Encounter: Payer: No Typology Code available for payment source | Admitting: Gastroenterology

## 2021-01-30 MED FILL — Duloxetine HCl Enteric Coated Pellets Cap 60 MG (Base Eq): ORAL | 90 days supply | Qty: 90 | Fill #0 | Status: AC

## 2021-01-31 ENCOUNTER — Other Ambulatory Visit (HOSPITAL_BASED_OUTPATIENT_CLINIC_OR_DEPARTMENT_OTHER): Payer: Self-pay

## 2021-02-01 ENCOUNTER — Other Ambulatory Visit (HOSPITAL_BASED_OUTPATIENT_CLINIC_OR_DEPARTMENT_OTHER): Payer: Self-pay

## 2021-02-01 ENCOUNTER — Other Ambulatory Visit (HOSPITAL_COMMUNITY): Payer: Self-pay

## 2021-02-13 ENCOUNTER — Other Ambulatory Visit (HOSPITAL_BASED_OUTPATIENT_CLINIC_OR_DEPARTMENT_OTHER): Payer: Self-pay

## 2021-02-13 ENCOUNTER — Other Ambulatory Visit: Payer: Self-pay

## 2021-02-13 ENCOUNTER — Ambulatory Visit (AMBULATORY_SURGERY_CENTER): Payer: No Typology Code available for payment source | Admitting: *Deleted

## 2021-02-13 VITALS — Ht 66.0 in | Wt 210.0 lb

## 2021-02-13 DIAGNOSIS — F418 Other specified anxiety disorders: Secondary | ICD-10-CM | POA: Insufficient documentation

## 2021-02-13 DIAGNOSIS — R911 Solitary pulmonary nodule: Secondary | ICD-10-CM | POA: Insufficient documentation

## 2021-02-13 DIAGNOSIS — J45909 Unspecified asthma, uncomplicated: Secondary | ICD-10-CM | POA: Insufficient documentation

## 2021-02-13 DIAGNOSIS — E559 Vitamin D deficiency, unspecified: Secondary | ICD-10-CM | POA: Insufficient documentation

## 2021-02-13 DIAGNOSIS — F419 Anxiety disorder, unspecified: Secondary | ICD-10-CM | POA: Insufficient documentation

## 2021-02-13 DIAGNOSIS — Z1211 Encounter for screening for malignant neoplasm of colon: Secondary | ICD-10-CM

## 2021-02-13 MED ORDER — NA SULFATE-K SULFATE-MG SULF 17.5-3.13-1.6 GM/177ML PO SOLN
ORAL | 0 refills | Status: DC
Start: 2021-02-13 — End: 2021-02-24
  Filled 2021-02-13 – 2021-02-20 (×2): qty 354, 1d supply, fill #0

## 2021-02-13 NOTE — Progress Notes (Signed)
Patient's pre-visit was done today over the phone with the patient due to COVID-19 pandemic. Name,DOB and address verified. Insurance verified. Patient denies any allergies to Eggs and Soy. Patient denies any past surgeries. Patient denies taking diet pills or blood thinners. Packet of Prep instructions mailed to patient including a copy of a consent form-pt is aware.  Patient understands to call us back with any questions or concerns. The patient is COVID-19 fully vaccinated, per patient. Patient is aware of our care-partner policy and VCBSW-96 safety protocol. EMMI education assigned to the patient for the procedure, sent to [facility].

## 2021-02-20 ENCOUNTER — Other Ambulatory Visit (HOSPITAL_BASED_OUTPATIENT_CLINIC_OR_DEPARTMENT_OTHER): Payer: Self-pay

## 2021-02-24 ENCOUNTER — Encounter: Payer: Self-pay | Admitting: Gastroenterology

## 2021-02-24 ENCOUNTER — Ambulatory Visit (AMBULATORY_SURGERY_CENTER): Payer: No Typology Code available for payment source | Admitting: Gastroenterology

## 2021-02-24 ENCOUNTER — Other Ambulatory Visit: Payer: Self-pay

## 2021-02-24 VITALS — BP 105/78 | HR 62 | Temp 97.3°F | Resp 15 | Ht 66.0 in | Wt 210.0 lb

## 2021-02-24 DIAGNOSIS — D122 Benign neoplasm of ascending colon: Secondary | ICD-10-CM

## 2021-02-24 DIAGNOSIS — Z1211 Encounter for screening for malignant neoplasm of colon: Secondary | ICD-10-CM | POA: Diagnosis not present

## 2021-02-24 DIAGNOSIS — D123 Benign neoplasm of transverse colon: Secondary | ICD-10-CM

## 2021-02-24 DIAGNOSIS — K635 Polyp of colon: Secondary | ICD-10-CM

## 2021-02-24 MED ORDER — SODIUM CHLORIDE 0.9 % IV SOLN: 500.0000 mL | Freq: Once | INTRAVENOUS | Status: DC

## 2021-02-24 NOTE — Progress Notes (Signed)
PT taken to PACU. Monitors in place. VSS. Report given to RN. 

## 2021-02-24 NOTE — Progress Notes (Signed)
Pt's states no medical or surgical changes since previsit or office visit.  VS taken by [employer]

## 2021-02-24 NOTE — Progress Notes (Signed)
Called to room to assist during endoscopic procedure.  Patient ID and intended procedure confirmed with present staff. Received instructions for my participation in the procedure from the performing physician.  

## 2021-02-24 NOTE — Op Note (Signed)
[place] Patient Name: Melissa Weber Procedure Date: 02/24/2021 9:17 AM MRN: [id number] Endoscopist: Mauri Pole , MD Age: 50 Referring MD:  Date of Birth: [birth year] Gender: Female Account #: [id number] Procedure:                Colonoscopy Indications:              Screening for colorectal malignant neoplasm Medicines:                Monitored Anesthesia Care Procedure:                Pre-Anesthesia Assessment:                           - Prior to the procedure, a History and Physical                            was performed, and patient medications and                            allergies were reviewed. The patient's tolerance of                            previous anesthesia was also reviewed. The risks                            and benefits of the procedure and the sedation                            options and risks were discussed with the patient.                            All questions were answered, and informed consent                            was obtained. Prior Anticoagulants: The patient has                            taken no previous anticoagulant or antiplatelet                            agents. ASA Grade Assessment: II - A patient with                            mild systemic disease. After reviewing the risks                            and benefits, the patient was deemed in                            satisfactory condition to undergo the procedure.                           After obtaining informed consent, the colonoscope  was passed under direct vision. Throughout the                            procedure, the patient's blood pressure, pulse, and                            oxygen saturations were monitored continuously. The                            Olympus [id number]-[id number]) Colonoscope was                            introduced through the anus and advanced to the the                            cecum,  identified by appendiceal orifice and                            ileocecal valve. The colonoscopy was performed                            without difficulty. The patient tolerated the                            procedure well. The quality of the bowel                            preparation was excellent. The ileocecal valve,                            appendiceal orifice, and rectum were photographed. Scope In: 9:25:53 AM Scope Out: 9:49:51 AM Scope Withdrawal Time: 0 hours 12 minutes 36 seconds  Total Procedure Duration: 0 hours 23 minutes 58 seconds  Findings:                 The perianal and digital rectal examinations were                            normal.                           A 11 mm polyp was found in the ascending colon. The                            polyp was sessile. The polyp was removed with a                            cold snare. Resection and retrieval were complete.                           A 1 mm polyp was found in the transverse colon. The                            polyp was sessile. The polyp was removed  with a                            cold biopsy forceps. Resection and retrieval were                            complete.                           A few small-mouthed diverticula were found in the                            sigmoid colon and descending colon.                           Non-bleeding internal hemorrhoids were found during                            retroflexion. The hemorrhoids were small. Complications:            No immediate complications. Estimated Blood Loss:     Estimated blood loss was minimal. Impression:               - One 11 mm polyp in the ascending colon, removed                            with a cold snare. Resected and retrieved.                           - One 1 mm polyp in the transverse colon, removed                            with a cold biopsy forceps. Resected and retrieved.                           - Diverticulosis in the  sigmoid colon and in the                            descending colon.                           - Non-bleeding internal hemorrhoids. Recommendation:           - Patient has a contact number available for                            emergencies. The signs and symptoms of potential                            delayed complications were discussed with the                            patient. Return to normal activities tomorrow.                            Written discharge instructions were provided to the  patient.                           - Resume previous diet.                           - Continue present medications.                           - Await pathology results.                           - Repeat colonoscopy in 3 - 5 years for                            surveillance based on pathology results. Mauri Pole, MD 02/24/2021 9:55:23 AM This report has been signed electronically.

## 2021-02-24 NOTE — Patient Instructions (Signed)
YOU HAD AN ENDOSCOPIC PROCEDURE TODAY AT THE [facility]:   Refer to the procedure report that was given to you for any specific questions about what was found during the examination.  If the procedure report does not answer your questions, please call your gastroenterologist to clarify.  If you requested that your care partner not be given the details of your procedure findings, then the procedure report has been included in a sealed envelope for you to review at your convenience later.  YOU SHOULD EXPECT: Some feelings of bloating in the abdomen. Passage of more gas than usual.  Walking can help get rid of the air that was put into your GI tract during the procedure and reduce the bloating. If you had a lower endoscopy (such as a colonoscopy or flexible sigmoidoscopy) you may notice spotting of blood in your stool or on the toilet paper. If you underwent a bowel prep for your procedure, you may not have a normal bowel movement for a few days.  Please Note:  You might notice some irritation and congestion in your nose or some drainage.  This is from the oxygen used during your procedure.  There is no need for concern and it should clear up in a day or so.  SYMPTOMS TO REPORT IMMEDIATELY:   Following lower endoscopy (colonoscopy or flexible sigmoidoscopy):  Excessive amounts of blood in the stool  Significant tenderness or worsening of abdominal pains  Swelling of the abdomen that is new, acute  Fever of 100F or higher   Following upper endoscopy (EGD)  Vomiting of blood or coffee ground material  New chest pain or pain under the shoulder blades  Painful or persistently difficult swallowing  New shortness of breath  Fever of 100F or higher  Black, tarry-looking stools  For urgent or emergent issues, a gastroenterologist can be reached at any hour by calling [phone]. Do not use MyChart messaging for urgent concerns.    DIET:  We do recommend a small meal at first, but  then you may proceed to your regular diet.  Drink plenty of fluids but you should avoid alcoholic beverages for 24 hours.  ACTIVITY:  You should plan to take it easy for the rest of today and you should NOT DRIVE or use heavy machinery until tomorrow (because of the sedation medicines used during the test).    FOLLOW UP: Our staff will call the number listed on your records 48-72 hours following your procedure to check on you and address any questions or concerns that you may have regarding the information given to you following your procedure. If we do not reach you, we will leave a message.  We will attempt to reach you two times.  During this call, we will ask if you have developed any symptoms of COVID 19. If you develop any symptoms (ie: fever, flu-like symptoms, shortness of breath, cough etc.) before then, please call [phone].  If you test positive for Covid 19 in the 2 weeks post procedure, please call and report this information to us.    If any biopsies were taken you will be contacted by phone or by letter within the next 1-3 weeks.  Please call us at [phone] if you have not heard about the biopsies in 3 weeks.    SIGNATURES/CONFIDENTIALITY: You and/or your care partner have signed paperwork which will be entered into your electronic medical record.  These signatures attest to the fact that that the information above on   your After Visit Summary has been reviewed and is understood.  Full responsibility of the confidentiality of this discharge information lies with you and/or your care-partner. 

## 2021-02-28 ENCOUNTER — Telehealth: Payer: Self-pay | Admitting: *Deleted

## 2021-02-28 NOTE — Telephone Encounter (Signed)
1. Have you developed a fever since your procedure? no  2.   Have you had an respiratory symptoms (SOB or cough) since your procedure? no  3.   Have you tested positive for COVID 19 since your procedure no  4.   Have you had any family members/close contacts diagnosed with the COVID 19 since your procedure?  no   If yes to any of these questions please route to Joylene John, RN and Joella Prince, RN Follow up Call-  Call back number 02/24/2021  Post procedure Call Back phone  # [phone]  Permission to leave phone message Yes  Some recent data might be hidden     Patient questions:  Do you have a fever, pain , or abdominal swelling? No. Pain Score  0 *  Have you tolerated food without any problems? Yes.    Have you been able to return to your normal activities? Yes.    Do you have any questions about your discharge instructions: Diet   No. Medications  No. Follow up visit  No.  Do you have questions or concerns about your Care? No.  Actions: * If pain score is 4 or above: No action needed, pain <4.

## 2021-03-17 ENCOUNTER — Encounter: Payer: Self-pay | Admitting: Gastroenterology

## 2021-04-12 ENCOUNTER — Other Ambulatory Visit (HOSPITAL_BASED_OUTPATIENT_CLINIC_OR_DEPARTMENT_OTHER): Payer: Self-pay

## 2021-04-12 MED ORDER — CARESTART COVID-19 HOME TEST VI KIT
PACK | 0 refills | Status: DC
  Filled 2021-04-12: qty 2, 4d supply, fill #0

## 2021-04-23 MED FILL — Duloxetine HCl Enteric Coated Pellets Cap 60 MG (Base Eq): ORAL | 90 days supply | Qty: 90 | Fill #1 | Status: AC

## 2021-04-24 ENCOUNTER — Other Ambulatory Visit (HOSPITAL_BASED_OUTPATIENT_CLINIC_OR_DEPARTMENT_OTHER): Payer: Self-pay

## 2021-06-05 ENCOUNTER — Other Ambulatory Visit (HOSPITAL_BASED_OUTPATIENT_CLINIC_OR_DEPARTMENT_OTHER): Payer: Self-pay

## 2021-06-05 MED ORDER — CARESTART COVID-19 HOME TEST VI KIT
PACK | 0 refills | Status: DC
  Filled 2021-06-05: qty 2, 4d supply, fill #0

## 2021-07-06 ENCOUNTER — Other Ambulatory Visit (HOSPITAL_BASED_OUTPATIENT_CLINIC_OR_DEPARTMENT_OTHER): Payer: Self-pay | Admitting: Internal Medicine

## 2021-07-06 DIAGNOSIS — Z1231 Encounter for screening mammogram for malignant neoplasm of breast: Secondary | ICD-10-CM

## 2021-07-10 ENCOUNTER — Ambulatory Visit (HOSPITAL_BASED_OUTPATIENT_CLINIC_OR_DEPARTMENT_OTHER)
Admission: RE | Admit: 2021-07-10 | Discharge: 2021-07-10 | Disposition: A | Discharge status: Home / Self Care | Payer: No Typology Code available for payment source | Source: Ambulatory Visit | Attending: Internal Medicine | Admitting: Internal Medicine

## 2021-07-10 ENCOUNTER — Encounter (HOSPITAL_BASED_OUTPATIENT_CLINIC_OR_DEPARTMENT_OTHER): Payer: Self-pay

## 2021-07-10 ENCOUNTER — Other Ambulatory Visit: Payer: Self-pay

## 2021-07-10 DIAGNOSIS — Z1231 Encounter for screening mammogram for malignant neoplasm of breast: Secondary | ICD-10-CM | POA: Insufficient documentation

## 2021-07-17 ENCOUNTER — Other Ambulatory Visit (HOSPITAL_BASED_OUTPATIENT_CLINIC_OR_DEPARTMENT_OTHER): Payer: Self-pay

## 2021-07-17 MED ORDER — DULOXETINE HCL 60 MG PO CPEP
ORAL_CAPSULE | ORAL | 4 refills | Status: DC
  Filled 2021-07-17: qty 90, 90d supply, fill #0
  Filled 2021-10-23: qty 90, 90d supply, fill #1
  Filled 2022-01-24: qty 90, 90d supply, fill #2
  Filled 2022-04-22: qty 90, 90d supply, fill #3

## 2021-08-21 IMAGING — MG DIGITAL SCREENING BILAT W/ TOMO W/ CAD
6 of 10 series · 6 of 30 positions shown · non-contrast
Comparison: Previous exam(s).

CLINICAL DATA: Screening.

EXAM:
DIGITAL SCREENING BILATERAL MAMMOGRAM WITH TOMO AND CAD

[R CC synth-2D]
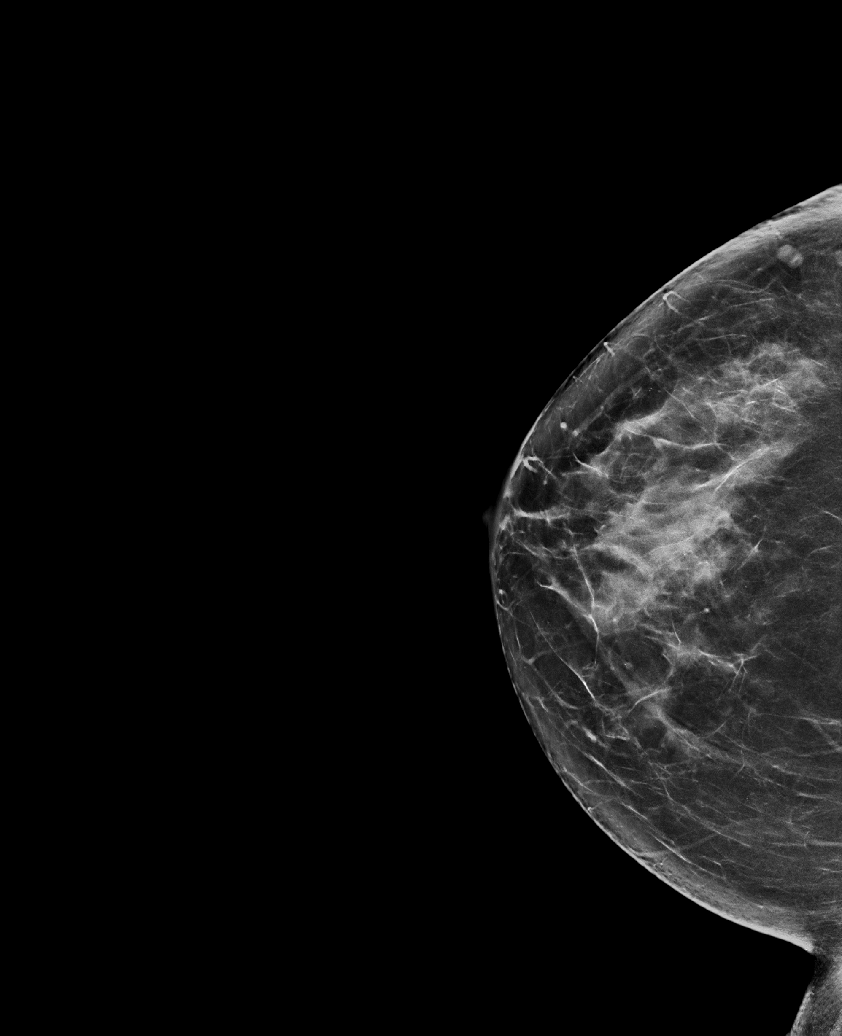

[R XCCL synth-2D]
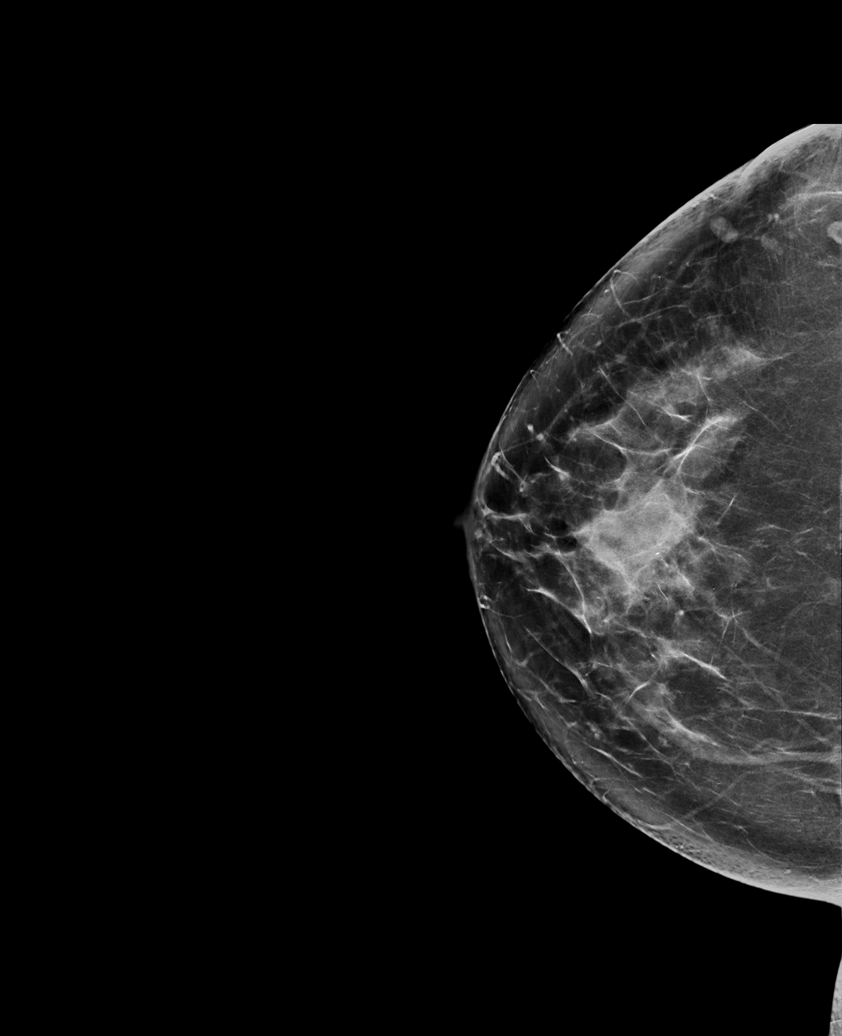

[R MLO synth-2D]
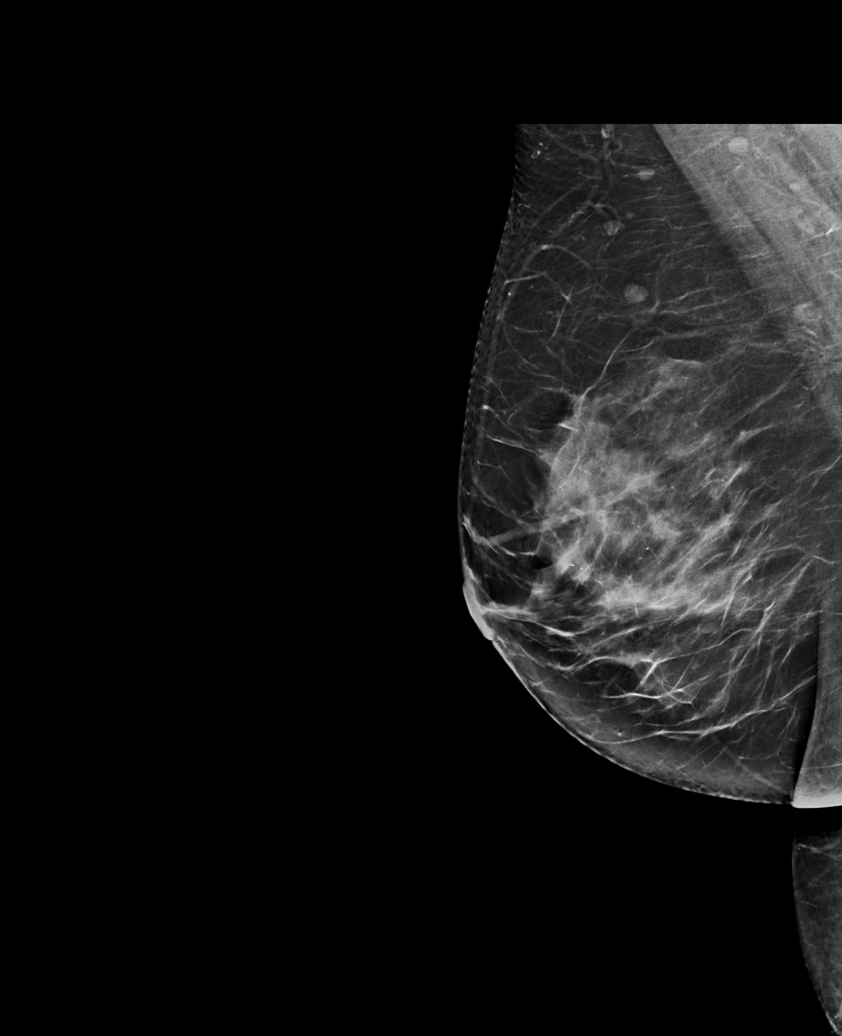

[L CC synth-2D]
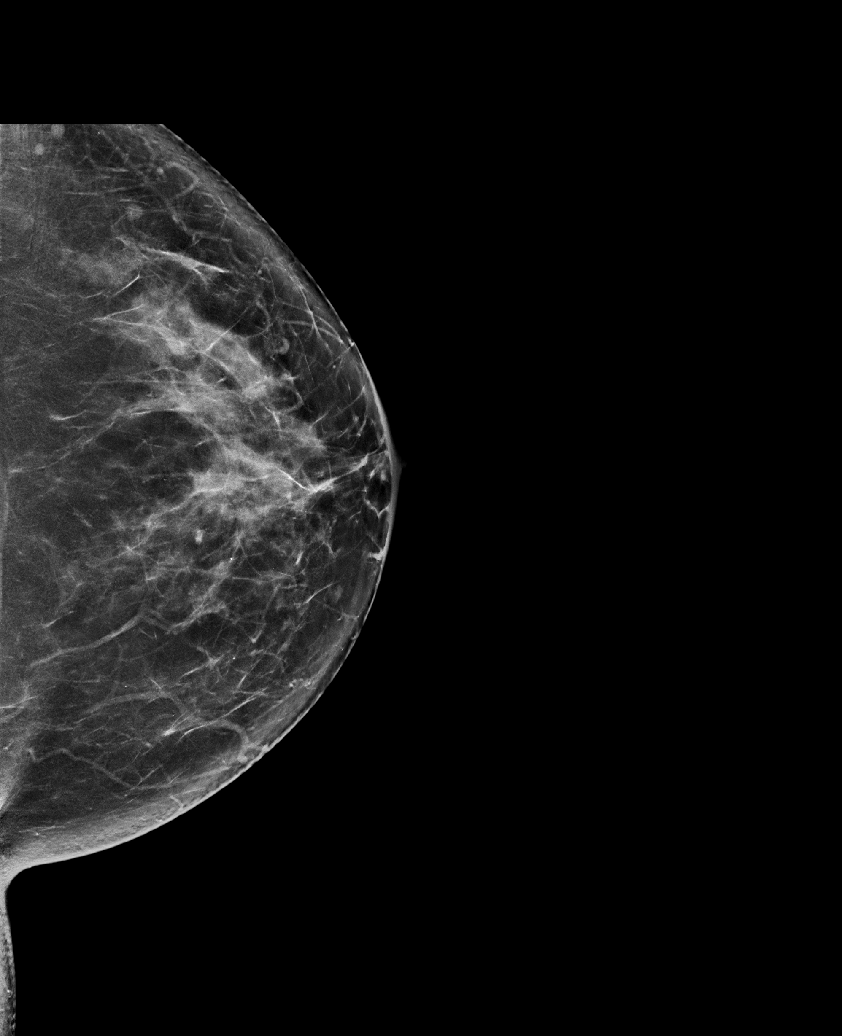

[L MLO synth-2D]
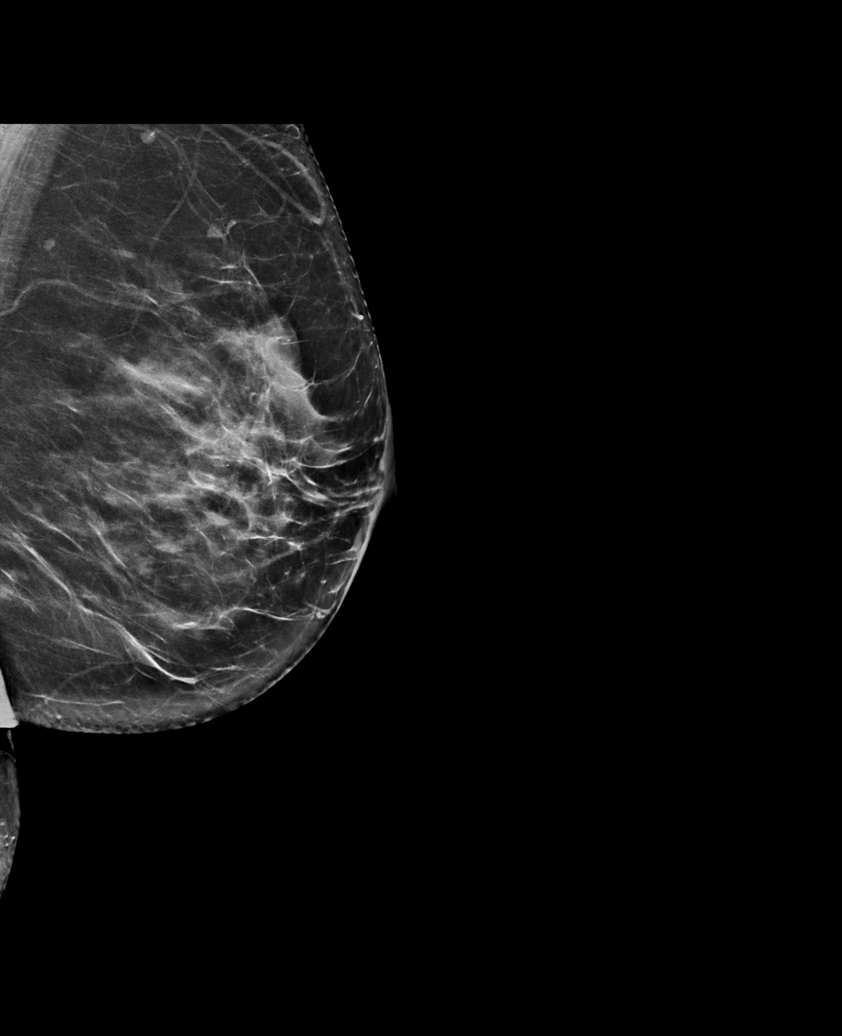

[L MLO tomo · tomo slice 39/76.0]
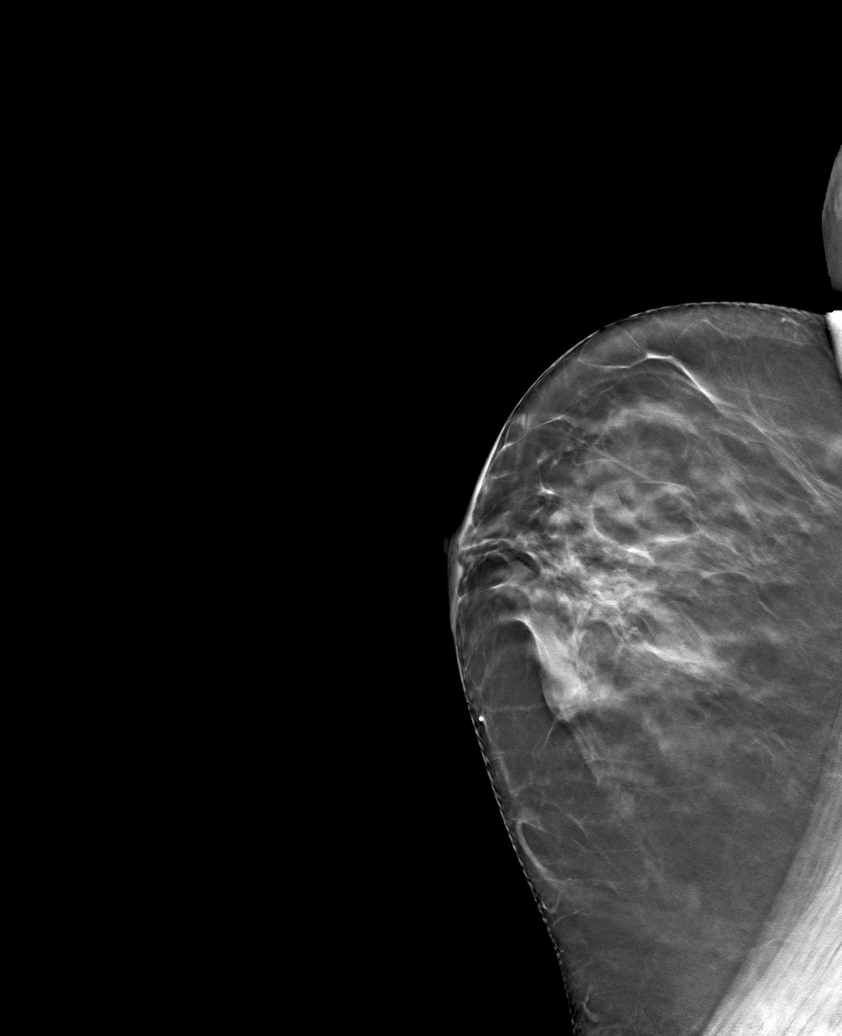

[6 of 30 positions shown; findings below may reference images not displayed]

ACR Breast Density Category c: The breast tissue is heterogeneously
dense, which may obscure small masses.
FINDINGS: There are no findings suspicious for malignancy. Images were
processed with CAD.
IMPRESSION: No mammographic evidence of malignancy. A result letter of this
screening mammogram will be mailed directly to the patient.

RECOMMENDATION:
Screening mammogram in one year. (Code:FT-[id number])

BI-RADS CATEGORY  1: Negative.

## 2021-09-28 LAB — HM PAP SMEAR

## 2021-10-24 ENCOUNTER — Other Ambulatory Visit (HOSPITAL_BASED_OUTPATIENT_CLINIC_OR_DEPARTMENT_OTHER): Payer: Self-pay

## 2022-01-25 ENCOUNTER — Other Ambulatory Visit (HOSPITAL_BASED_OUTPATIENT_CLINIC_OR_DEPARTMENT_OTHER): Payer: Self-pay

## 2022-04-23 ENCOUNTER — Other Ambulatory Visit (HOSPITAL_BASED_OUTPATIENT_CLINIC_OR_DEPARTMENT_OTHER): Payer: Self-pay

## 2022-07-18 ENCOUNTER — Other Ambulatory Visit (HOSPITAL_BASED_OUTPATIENT_CLINIC_OR_DEPARTMENT_OTHER): Payer: Self-pay

## 2022-07-18 MED ORDER — DULOXETINE HCL 60 MG PO CPEP
60.0000 mg | ORAL_CAPSULE | Freq: Every day | ORAL | 5 refills | Status: DC
  Filled 2022-07-18: qty 90, 90d supply, fill #0
  Filled 2022-10-27: qty 90, 90d supply, fill #1
  Filled 2023-01-23: qty 90, 90d supply, fill #2
  Filled 2023-05-01: qty 90, 90d supply, fill #3

## 2022-10-30 ENCOUNTER — Other Ambulatory Visit (HOSPITAL_BASED_OUTPATIENT_CLINIC_OR_DEPARTMENT_OTHER): Payer: Self-pay

## 2022-11-15 ENCOUNTER — Other Ambulatory Visit (HOSPITAL_BASED_OUTPATIENT_CLINIC_OR_DEPARTMENT_OTHER): Payer: Self-pay | Admitting: Obstetrics and Gynecology

## 2022-11-15 DIAGNOSIS — Z1231 Encounter for screening mammogram for malignant neoplasm of breast: Secondary | ICD-10-CM

## 2022-11-20 ENCOUNTER — Other Ambulatory Visit (HOSPITAL_BASED_OUTPATIENT_CLINIC_OR_DEPARTMENT_OTHER): Payer: Self-pay

## 2022-11-20 MED ORDER — SHINGRIX 50 MCG/0.5ML IM SUSR
INTRAMUSCULAR | 1 refills | Status: DC
  Filled 2022-11-20: qty 1, 1d supply, fill #0

## 2022-12-06 ENCOUNTER — Ambulatory Visit (HOSPITAL_BASED_OUTPATIENT_CLINIC_OR_DEPARTMENT_OTHER)
Admission: RE | Admit: 2022-12-06 | Discharge: 2022-12-06 | Disposition: A | Discharge status: Home / Self Care | Payer: 59 | Source: Ambulatory Visit | Attending: Obstetrics and Gynecology | Admitting: Obstetrics and Gynecology

## 2022-12-06 ENCOUNTER — Encounter (HOSPITAL_BASED_OUTPATIENT_CLINIC_OR_DEPARTMENT_OTHER): Payer: Self-pay

## 2022-12-06 DIAGNOSIS — Z1231 Encounter for screening mammogram for malignant neoplasm of breast: Secondary | ICD-10-CM | POA: Insufficient documentation

## 2023-01-16 DIAGNOSIS — Z01419 Encounter for gynecological examination (general) (routine) without abnormal findings: Secondary | ICD-10-CM | POA: Diagnosis not present

## 2023-01-16 DIAGNOSIS — Z6836 Body mass index (BMI) 36.0-36.9, adult: Secondary | ICD-10-CM | POA: Diagnosis not present

## 2023-01-24 ENCOUNTER — Other Ambulatory Visit (HOSPITAL_BASED_OUTPATIENT_CLINIC_OR_DEPARTMENT_OTHER): Payer: Self-pay

## 2023-03-13 DIAGNOSIS — D1801 Hemangioma of skin and subcutaneous tissue: Secondary | ICD-10-CM | POA: Diagnosis not present

## 2023-03-13 DIAGNOSIS — L821 Other seborrheic keratosis: Secondary | ICD-10-CM | POA: Diagnosis not present

## 2023-03-13 DIAGNOSIS — L814 Other melanin hyperpigmentation: Secondary | ICD-10-CM | POA: Diagnosis not present

## 2023-03-13 DIAGNOSIS — L905 Scar conditions and fibrosis of skin: Secondary | ICD-10-CM | POA: Diagnosis not present

## 2023-03-13 DIAGNOSIS — L918 Other hypertrophic disorders of the skin: Secondary | ICD-10-CM | POA: Diagnosis not present

## 2023-03-13 DIAGNOSIS — L738 Other specified follicular disorders: Secondary | ICD-10-CM | POA: Diagnosis not present

## 2023-04-05 ENCOUNTER — Other Ambulatory Visit (HOSPITAL_BASED_OUTPATIENT_CLINIC_OR_DEPARTMENT_OTHER): Payer: Self-pay

## 2023-04-05 MED ORDER — ZOSTER VAC RECOMB ADJUVANTED 50 MCG/0.5ML IM SUSR
0.5000 mL | Freq: Once | INTRAMUSCULAR | 0 refills | Status: AC
  Filled 2023-04-05: qty 0.5, 1d supply, fill #0

## 2023-05-27 DIAGNOSIS — H524 Presbyopia: Secondary | ICD-10-CM | POA: Diagnosis not present

## 2023-07-23 ENCOUNTER — Encounter: Payer: Self-pay | Admitting: Physician Assistant

## 2023-07-23 NOTE — Progress Notes (Unsigned)
New patient visit   Patient: Melissa Weber   DOB: 11/24/1970   52 y.o. Female  MRN: [id number] Visit Date: 07/24/2023  Today's healthcare provider: Alfredia Ferguson, PA-C  Cc. New patient, fatigue  Subjective    Melissa Weber is a 52 y.o. female who presents today as a new patient to establish care.  HPI  Discussed the use of AI scribe software for clinical note transcription with the patient, who gave verbal consent to proceed.  History of Present Illness   Melissa Weber, a 52 year old with a long-standing history of anxiety and depression, presents for a new patient visit. She reports feeling extremely fatigued, which she attributes to her heavy menstrual periods and difficulty sleeping. She has been on Cymbalta for several years for her mental health conditions and is unsure if her current symptoms are related to her medication or other health issues. Melissa Weber is still menstruating regularly but reports that her periods have become significantly heavier. She is unsure if this is related to perimenopause as she does not have information about her mother's menopausal history. Melissa Weber also reports difficulty sleeping and has been trying melatonin, 10 mg, but it causes some daytime drowsiness.        Past Medical History:  Diagnosis Date   Anxiety    Depression    Vitamin D deficiency    Past Surgical History:  Procedure Laterality Date   NO PAST SURGERIES     Family Status  Relation Name Status   Neg Hx  (Not Specified)  No partnership data on file   Family History  Problem Relation Age of Onset   Colon cancer Neg Hx    Colon polyps Neg Hx    Esophageal cancer Neg Hx    Rectal cancer Neg Hx    Stomach cancer Neg Hx    Social History   Socioeconomic History   Marital status: Married    Spouse name: Not on file   Number of children: Not on file   Years of education: Not on file   Highest education level: Not on file  Occupational History   Not on file  Tobacco Use    Smoking status: Never   Smokeless tobacco: Never  Vaping Use   Vaping status: Never Used  Substance and Sexual Activity   Alcohol use: No   Drug use: No   Sexual activity: Yes    Comment: Husbands vasectomy  Other Topics Concern   Not on file  Social History Narrative   Not on file   Social Determinants of Health   Financial Resource Strain: Not on file  Food Insecurity: Not on file  Transportation Needs: Not on file  Physical Activity: Not on file  Stress: Not on file  Social Connections: Not on file   Outpatient Medications Prior to Visit  Medication Sig Note   DULoxetine (CYMBALTA) 60 MG capsule Take 1 capsule (60 mg total) by mouth daily.    ibuprofen (ADVIL,MOTRIN) 200 MG tablet Take 400 mg by mouth every 6 (six) hours as needed for moderate pain. 07/24/2023: PRN   [DISCONTINUED] COVID-19 At Home Antigen Test (CARESTART COVID-19 HOME TEST) KIT Use as directed within package instructions    [DISCONTINUED] VITAMIN D PO Take by mouth daily. (Patient not taking: Reported on 07/24/2023)    [DISCONTINUED] Zoster Vaccine Adjuvanted [facility]) injection Inject into the muscle.    No facility-administered medications prior to visit.   No Known Allergies  Immunization History  Administered Date(s) Administered  PFIZER(Purple Top)SARS-COV-2 Vaccination 09/13/2020   Zoster Recombinant(Shingrix) 11/20/2022, 04/05/2023    Health Maintenance  Topic Date Due   HIV Screening  Never done   Hepatitis C Screening  Never done   DTaP/Tdap/Td (1 - Tdap) Never done   Cervical Cancer Screening (HPV/Pap Cotest)  Never done   COVID-19 Vaccine (2 - 2023-24 season) 06/30/2023   INFLUENZA VACCINE  01/27/2024 (Originally 05/30/2023)   Colonoscopy  02/25/2024   MAMMOGRAM  12/06/2024   Zoster Vaccines- Shingrix  Completed   HPV VACCINES  Aged Out    Patient Care Team: Alfredia Ferguson, PA-C as PCP - General (Physician Assistant) Richardean Chimera, MD as Consulting Physician (Obstetrics and  Gynecology)  Review of Systems  Constitutional:  Positive for fatigue. Negative for fever.  Respiratory:  Negative for cough and shortness of breath.   Cardiovascular:  Negative for chest pain and leg swelling.  Gastrointestinal:  Negative for abdominal pain.  Genitourinary:  Positive for menstrual problem.  Neurological:  Negative for dizziness and headaches.       Objective    BP 118/74   Pulse 64   Temp 98.1 F (36.7 C) (Oral)   Resp 16   Ht 5' 5.25" (1.657 m)   Wt 223 lb 6 oz (101.3 kg)   LMP 07/21/2023 (Exact Date)   SpO2 97%   BMI 36.89 kg/m   Physical Exam Constitutional:      General: She is awake.     Appearance: She is well-developed.  HENT:     Head: Normocephalic.  Eyes:     Conjunctiva/sclera: Conjunctivae normal.  Cardiovascular:     Rate and Rhythm: Normal rate and regular rhythm.     Heart sounds: Normal heart sounds.  Pulmonary:     Effort: Pulmonary effort is normal.     Breath sounds: Normal breath sounds.  Musculoskeletal:     Right lower leg: No edema.     Left lower leg: No edema.  Skin:    General: Skin is warm.  Neurological:     Mental Status: She is alert and oriented to person, place, and time.  Psychiatric:        Attention and Perception: Attention normal.        Mood and Affect: Mood normal.        Speech: Speech normal.        Behavior: Behavior is cooperative.     Depression Screen    07/24/2023   10:08 AM  PHQ 2/9 Scores  PHQ - 2 Score 2  PHQ- 9 Score 10   No results found for any visits on 07/24/23.  Assessment & Plan      Problem List Items Addressed This Visit       Other   Anxiety and depression    No suicidal ideation. Discussed potential addition of Wellbutrin if other causes of fatigue are ruled out. -Continue Cymbalta 60 mg -Consider addition of Wellbutrin pending results of blood work.      Vitamin D deficiency   Relevant Orders   Vitamin D (25 hydroxy)   Menorrhagia with regular cycle - Primary     Order pelvic ultrasound -Check for anemia and iron deficiency with blood work today.      Relevant Orders   CBC w/Diff   IBC + Ferritin   US PELVIC COMPLETE WITH TRANSVAGINAL   Insomnia    Difficulty sleeping, currently taking 10mg  melatonin with hangover-like symptoms in the morning. Discussed reducing melatonin dose and potential future steps if  sleep does not improve. -Reduce melatonin dose to 2-5mg  at bedtime. -Consider trial of hydroxyzine if sleep does not improve with reduced melatonin dose.      Relevant Orders   Comp Met (CMET)   TSH   T4, free   Other Visit Diagnoses     Lipid screening       Relevant Orders   Lipid panel   Family history of diabetes mellitus (DM)       Relevant Orders   HgB A1c        General Health Maintenance -Check tetanus status as we do not have on file -Order blood work today to check cholesterol, glucose, thyroid function, and vitamin D levels.        Return in about 4 weeks (around 08/21/2023) for insomnia.     I, Alfredia Ferguson, PA-C have reviewed all documentation for this visit. The documentation on  07/24/23   for the exam, diagnosis, procedures, and orders are all accurate and complete.    Alfredia Ferguson, PA-C  [facility] at [facility] [phone] (phone) [phone] (fax)  [facility]

## 2023-07-24 ENCOUNTER — Ambulatory Visit: Payer: 59 | Admitting: Physician Assistant

## 2023-07-24 ENCOUNTER — Other Ambulatory Visit (HOSPITAL_BASED_OUTPATIENT_CLINIC_OR_DEPARTMENT_OTHER): Payer: Self-pay

## 2023-07-24 ENCOUNTER — Encounter: Payer: Self-pay | Admitting: Physician Assistant

## 2023-07-24 VITALS — BP 118/74 | HR 64 | Temp 98.1°F | Resp 16 | Ht 65.25 in | Wt 223.4 lb

## 2023-07-24 DIAGNOSIS — G4700 Insomnia, unspecified: Secondary | ICD-10-CM | POA: Diagnosis not present

## 2023-07-24 DIAGNOSIS — Z13220 Encounter for screening for lipoid disorders: Secondary | ICD-10-CM | POA: Diagnosis not present

## 2023-07-24 DIAGNOSIS — Z833 Family history of diabetes mellitus: Secondary | ICD-10-CM | POA: Diagnosis not present

## 2023-07-24 DIAGNOSIS — F32A Depression, unspecified: Secondary | ICD-10-CM | POA: Diagnosis not present

## 2023-07-24 DIAGNOSIS — F419 Anxiety disorder, unspecified: Secondary | ICD-10-CM | POA: Diagnosis not present

## 2023-07-24 DIAGNOSIS — N920 Excessive and frequent menstruation with regular cycle: Secondary | ICD-10-CM | POA: Insufficient documentation

## 2023-07-24 DIAGNOSIS — E559 Vitamin D deficiency, unspecified: Secondary | ICD-10-CM

## 2023-07-24 LAB — CBC WITH DIFFERENTIAL/PLATELET
Basophils Absolute: 0 K/uL (ref 0.0–0.1)
Basophils Relative: 0.6 % (ref 0.0–3.0)
Eosinophils Absolute: 0.2 K/uL (ref 0.0–0.7)
Eosinophils Relative: 3.7 % (ref 0.0–5.0)
HCT: 38 % (ref 36.0–46.0)
Hemoglobin: 12.7 g/dL (ref 12.0–15.0)
Lymphocytes Relative: 28.6 % (ref 12.0–46.0)
Lymphs Abs: 1.8 K/uL (ref 0.7–4.0)
MCHC: 33.5 g/dL (ref 30.0–36.0)
MCV: 92.2 fl (ref 78.0–100.0)
Monocytes Absolute: 0.5 K/uL (ref 0.1–1.0)
Monocytes Relative: 8.9 % (ref 3.0–12.0)
Neutro Abs: 3.6 K/uL (ref 1.4–7.7)
Neutrophils Relative %: 58.2 % (ref 43.0–77.0)
Platelets: 309 K/uL (ref 150.0–400.0)
RBC: 4.12 Mil/uL (ref 3.87–5.11)
RDW: 12.8 % (ref 11.5–15.5)
WBC: 6.2 K/uL (ref 4.0–10.5)

## 2023-07-24 LAB — IBC + FERRITIN
Ferritin: 24.1 ng/mL (ref 10.0–291.0)
Iron: 92 ug/dL (ref 42–145)
Saturation Ratios: 27.3 % (ref 20.0–50.0)
TIBC: 337.4 ug/dL (ref 250.0–450.0)
Transferrin: 241 mg/dL (ref 212.0–360.0)

## 2023-07-24 LAB — HEMOGLOBIN A1C: Hgb A1c MFr Bld: 5.7 % (ref 4.6–6.5)

## 2023-07-24 LAB — COMPREHENSIVE METABOLIC PANEL WITH GFR
ALT: 10 U/L (ref 0–35)
AST: 12 U/L (ref 0–37)
Albumin: 3.9 g/dL (ref 3.5–5.2)
Alkaline Phosphatase: 54 U/L (ref 39–117)
BUN: 13 mg/dL (ref 6–23)
CO2: 29 meq/L (ref 19–32)
Calcium: 8.7 mg/dL (ref 8.4–10.5)
Chloride: 105 meq/L (ref 96–112)
Creatinine, Ser: 0.7 mg/dL (ref 0.40–1.20)
GFR: 98.51 mL/min
Glucose, Bld: 94 mg/dL (ref 70–99)
Potassium: 4.4 meq/L (ref 3.5–5.1)
Sodium: 139 meq/L (ref 135–145)
Total Bilirubin: 0.3 mg/dL (ref 0.2–1.2)
Total Protein: 6.2 g/dL (ref 6.0–8.3)

## 2023-07-24 LAB — LIPID PANEL
Cholesterol: 174 mg/dL (ref 0–200)
HDL: 44.1 mg/dL
LDL Cholesterol: 110 mg/dL — ABNORMAL HIGH (ref 0–99)
NonHDL: 129.45
Total CHOL/HDL Ratio: 4
Triglycerides: 99 mg/dL (ref 0.0–149.0)
VLDL: 19.8 mg/dL (ref 0.0–40.0)

## 2023-07-24 LAB — TSH: TSH: 1.69 u[IU]/mL (ref 0.35–5.50)

## 2023-07-24 LAB — VITAMIN D 25 HYDROXY (VIT D DEFICIENCY, FRACTURES): VITD: 14.32 ng/mL — ABNORMAL LOW (ref 30.00–100.00)

## 2023-07-24 LAB — T4, FREE: Free T4: 0.68 ng/dL (ref 0.60–1.60)

## 2023-07-24 NOTE — Assessment & Plan Note (Signed)
Order pelvic ultrasound -Check for anemia and iron deficiency with blood work today.

## 2023-07-24 NOTE — Assessment & Plan Note (Signed)
Difficulty sleeping, currently taking 10mg  melatonin with hangover-like symptoms in the morning. Discussed reducing melatonin dose and potential future steps if sleep does not improve. -Reduce melatonin dose to 2-5mg  at bedtime. -Consider trial of hydroxyzine if sleep does not improve with reduced melatonin dose.

## 2023-07-24 NOTE — Assessment & Plan Note (Addendum)
No suicidal ideation. Discussed potential addition of Wellbutrin if other causes of fatigue are ruled out. -Continue Cymbalta 60 mg -Consider addition of Wellbutrin pending results of blood work.

## 2023-07-25 ENCOUNTER — Other Ambulatory Visit (HOSPITAL_BASED_OUTPATIENT_CLINIC_OR_DEPARTMENT_OTHER): Payer: Self-pay

## 2023-07-25 MED ORDER — DULOXETINE HCL 60 MG PO CPEP
60.0000 mg | ORAL_CAPSULE | Freq: Every day | ORAL | 0 refills | Status: DC
  Filled 2023-07-25: qty 90, 90d supply, fill #0

## 2023-07-26 ENCOUNTER — Encounter: Payer: Self-pay | Admitting: Physician Assistant

## 2023-08-01 ENCOUNTER — Other Ambulatory Visit (HOSPITAL_BASED_OUTPATIENT_CLINIC_OR_DEPARTMENT_OTHER): Payer: Self-pay

## 2023-08-01 ENCOUNTER — Ambulatory Visit: Payer: 59 | Admitting: Physician Assistant

## 2023-08-01 DIAGNOSIS — M67911 Unspecified disorder of synovium and tendon, right shoulder: Secondary | ICD-10-CM | POA: Diagnosis not present

## 2023-08-01 MED ORDER — MELOXICAM 15 MG PO TABS
15.0000 mg | ORAL_TABLET | Freq: Every day | ORAL | 0 refills | Status: DC
  Filled 2023-08-01: qty 30, 30d supply, fill #0

## 2023-09-18 ENCOUNTER — Encounter: Payer: Self-pay | Admitting: Physician Assistant

## 2023-10-30 ENCOUNTER — Other Ambulatory Visit (HOSPITAL_BASED_OUTPATIENT_CLINIC_OR_DEPARTMENT_OTHER): Payer: Self-pay

## 2023-10-31 ENCOUNTER — Other Ambulatory Visit (HOSPITAL_BASED_OUTPATIENT_CLINIC_OR_DEPARTMENT_OTHER): Payer: Self-pay

## 2023-10-31 MED ORDER — DULOXETINE HCL 60 MG PO CPEP
60.0000 mg | ORAL_CAPSULE | Freq: Every day | ORAL | 0 refills | Status: DC
  Filled 2023-10-31: qty 90, 90d supply, fill #0

## 2024-01-23 ENCOUNTER — Other Ambulatory Visit (HOSPITAL_BASED_OUTPATIENT_CLINIC_OR_DEPARTMENT_OTHER): Payer: Self-pay

## 2024-01-24 ENCOUNTER — Other Ambulatory Visit (HOSPITAL_BASED_OUTPATIENT_CLINIC_OR_DEPARTMENT_OTHER): Payer: Self-pay

## 2024-01-24 MED ORDER — DULOXETINE HCL 60 MG PO CPEP
60.0000 mg | ORAL_CAPSULE | Freq: Every day | ORAL | 0 refills | Status: DC
  Filled 2024-01-24: qty 90, 90d supply, fill #0

## 2024-01-29 ENCOUNTER — Encounter: Payer: Self-pay | Admitting: Physician Assistant

## 2024-03-10 ENCOUNTER — Telehealth: Admitting: Physician Assistant

## 2024-03-10 DIAGNOSIS — L2082 Flexural eczema: Secondary | ICD-10-CM | POA: Diagnosis not present

## 2024-03-10 MED ORDER — PREDNISONE 20 MG PO TABS
40.0000 mg | ORAL_TABLET | Freq: Every day | ORAL | 0 refills | Status: DC
  Filled 2024-03-10: qty 10, 5d supply, fill #0

## 2024-03-10 NOTE — Progress Notes (Signed)

## 2024-03-11 ENCOUNTER — Other Ambulatory Visit (HOSPITAL_BASED_OUTPATIENT_CLINIC_OR_DEPARTMENT_OTHER): Payer: Self-pay

## 2024-04-19 ENCOUNTER — Other Ambulatory Visit (HOSPITAL_BASED_OUTPATIENT_CLINIC_OR_DEPARTMENT_OTHER): Payer: Self-pay

## 2024-04-20 ENCOUNTER — Other Ambulatory Visit: Payer: Self-pay | Admitting: Physician Assistant

## 2024-04-20 ENCOUNTER — Encounter (HOSPITAL_BASED_OUTPATIENT_CLINIC_OR_DEPARTMENT_OTHER): Payer: Self-pay

## 2024-04-20 ENCOUNTER — Other Ambulatory Visit: Payer: Self-pay | Admitting: *Deleted

## 2024-04-20 ENCOUNTER — Other Ambulatory Visit (HOSPITAL_BASED_OUTPATIENT_CLINIC_OR_DEPARTMENT_OTHER): Payer: Self-pay

## 2024-04-22 ENCOUNTER — Other Ambulatory Visit (HOSPITAL_BASED_OUTPATIENT_CLINIC_OR_DEPARTMENT_OTHER): Payer: Self-pay

## 2024-04-22 ENCOUNTER — Other Ambulatory Visit: Payer: Self-pay | Admitting: Physician Assistant

## 2024-04-22 DIAGNOSIS — F419 Anxiety disorder, unspecified: Secondary | ICD-10-CM

## 2024-04-22 MED ORDER — DULOXETINE HCL 60 MG PO CPEP
60.0000 mg | ORAL_CAPSULE | Freq: Every day | ORAL | 1 refills | Status: DC
  Filled 2024-04-22: qty 30, 30d supply, fill #0

## 2024-04-29 NOTE — Progress Notes (Unsigned)
      Established patient visit   Patient: Melissa Weber   DOB: [birth year]   53 y.o. Female  MRN: [id number] Visit Date: 04/30/2024  Today's healthcare provider: Manuelita Flatness, PA-C   No chief complaint on file.  Subjective     ***  Medications: Outpatient Medications Prior to Visit  Medication Sig   DULoxetine  (CYMBALTA ) 60 MG capsule Take 1 capsule (60 mg total) by mouth daily.   ibuprofen  (ADVIL ,MOTRIN ) 200 MG tablet Take 400 mg by mouth every 6 (six) hours as needed for moderate pain.   predniSONE  (DELTASONE ) 20 MG tablet Take 2 tablets (40 mg total) by mouth daily with breakfast.   No facility-administered medications prior to visit.    Review of Systems {Insert previous labs (optional):23779} {See past labs  Heme  Chem  Endocrine  Serology  Results Review (optional):1}   Objective    There were no vitals taken for this visit. {Insert last BP/Wt (optional):23777}{See vitals history (optional):1}  Physical Exam  ***  No results found for any visits on 04/30/24.  Assessment & Plan    There are no diagnoses linked to this encounter.  ***  No follow-ups on file.       Manuelita Flatness, PA-C  [facility] at [facility] [phone] (phone) [phone] (fax)  [facility]

## 2024-04-30 ENCOUNTER — Encounter: Payer: Self-pay | Admitting: Physician Assistant

## 2024-04-30 ENCOUNTER — Ambulatory Visit: Admitting: Physician Assistant

## 2024-04-30 VITALS — BP 152/87 | HR 67 | Ht 62.5 in | Wt 232.2 lb

## 2024-04-30 DIAGNOSIS — F419 Anxiety disorder, unspecified: Secondary | ICD-10-CM | POA: Diagnosis not present

## 2024-04-30 DIAGNOSIS — F32A Depression, unspecified: Secondary | ICD-10-CM | POA: Diagnosis not present

## 2024-04-30 DIAGNOSIS — R030 Elevated blood-pressure reading, without diagnosis of hypertension: Secondary | ICD-10-CM

## 2024-04-30 MED ORDER — DULOXETINE HCL 60 MG PO CPEP: 60.0000 mg | ORAL_CAPSULE | Freq: Every day | ORAL | Status: DC

## 2024-04-30 NOTE — Assessment & Plan Note (Signed)
 Elevated in office today, unusual for her. Recommend checking at home f/b 4-6 weeks to review

## 2024-04-30 NOTE — Assessment & Plan Note (Signed)
 Cont cymbalta  60 mg, feeling well.

## 2024-05-04 DIAGNOSIS — Z01419 Encounter for gynecological examination (general) (routine) without abnormal findings: Secondary | ICD-10-CM | POA: Diagnosis not present

## 2024-05-04 DIAGNOSIS — Z6838 Body mass index (BMI) 38.0-38.9, adult: Secondary | ICD-10-CM | POA: Diagnosis not present

## 2024-05-04 DIAGNOSIS — Z1151 Encounter for screening for human papillomavirus (HPV): Secondary | ICD-10-CM | POA: Diagnosis not present

## 2024-05-04 DIAGNOSIS — Z1231 Encounter for screening mammogram for malignant neoplasm of breast: Secondary | ICD-10-CM | POA: Diagnosis not present

## 2024-05-04 DIAGNOSIS — Z124 Encounter for screening for malignant neoplasm of cervix: Secondary | ICD-10-CM | POA: Diagnosis not present

## 2024-05-04 LAB — HM MAMMOGRAPHY

## 2024-05-04 LAB — RESULTS CONSOLE HPV: CHL HPV: NEGATIVE

## 2024-05-04 LAB — HM PAP SMEAR: HPV, high-risk: NEGATIVE

## 2024-05-18 ENCOUNTER — Other Ambulatory Visit: Payer: Self-pay | Admitting: Physician Assistant

## 2024-05-18 ENCOUNTER — Other Ambulatory Visit (HOSPITAL_BASED_OUTPATIENT_CLINIC_OR_DEPARTMENT_OTHER): Payer: Self-pay

## 2024-05-18 DIAGNOSIS — F419 Anxiety disorder, unspecified: Secondary | ICD-10-CM

## 2024-05-18 MED ORDER — DULOXETINE HCL 60 MG PO CPEP
60.0000 mg | ORAL_CAPSULE | Freq: Every day | ORAL | 1 refills | Status: DC
  Filled 2024-05-18: qty 30, 30d supply, fill #0
  Filled 2024-06-23: qty 30, 30d supply, fill #1

## 2024-06-08 ENCOUNTER — Ambulatory Visit: Admitting: Physician Assistant

## 2024-06-08 DIAGNOSIS — H5203 Hypermetropia, bilateral: Secondary | ICD-10-CM | POA: Diagnosis not present

## 2024-06-08 DIAGNOSIS — H524 Presbyopia: Secondary | ICD-10-CM | POA: Diagnosis not present

## 2024-06-15 ENCOUNTER — Encounter: Payer: Self-pay | Admitting: Gastroenterology

## 2024-07-23 ENCOUNTER — Other Ambulatory Visit: Payer: Self-pay

## 2024-07-24 ENCOUNTER — Encounter: Admitting: Physician Assistant

## 2024-07-24 ENCOUNTER — Encounter: Payer: Self-pay | Admitting: Gastroenterology

## 2024-07-24 ENCOUNTER — Ambulatory Visit: Admitting: Family Medicine

## 2024-07-24 ENCOUNTER — Other Ambulatory Visit: Payer: Self-pay

## 2024-07-24 ENCOUNTER — Encounter: Payer: Self-pay | Admitting: Family Medicine

## 2024-07-24 ENCOUNTER — Other Ambulatory Visit (HOSPITAL_BASED_OUTPATIENT_CLINIC_OR_DEPARTMENT_OTHER): Payer: Self-pay

## 2024-07-24 ENCOUNTER — Ambulatory Visit (AMBULATORY_SURGERY_CENTER): Admitting: *Deleted

## 2024-07-24 VITALS — Ht 62.5 in | Wt 225.0 lb

## 2024-07-24 VITALS — BP 129/64 | HR 79 | Ht 62.5 in | Wt 220.0 lb

## 2024-07-24 DIAGNOSIS — Z13220 Encounter for screening for lipoid disorders: Secondary | ICD-10-CM | POA: Diagnosis not present

## 2024-07-24 DIAGNOSIS — F419 Anxiety disorder, unspecified: Secondary | ICD-10-CM

## 2024-07-24 DIAGNOSIS — Z860100 Personal history of colon polyps, unspecified: Secondary | ICD-10-CM

## 2024-07-24 DIAGNOSIS — R7303 Prediabetes: Secondary | ICD-10-CM

## 2024-07-24 DIAGNOSIS — F32A Depression, unspecified: Secondary | ICD-10-CM | POA: Diagnosis not present

## 2024-07-24 DIAGNOSIS — E559 Vitamin D deficiency, unspecified: Secondary | ICD-10-CM

## 2024-07-24 DIAGNOSIS — Z0000 Encounter for general adult medical examination without abnormal findings: Secondary | ICD-10-CM | POA: Diagnosis not present

## 2024-07-24 LAB — CBC WITH DIFFERENTIAL/PLATELET
Basophils Absolute: 0 K/uL (ref 0.0–0.1)
Basophils Relative: 0.8 % (ref 0.0–3.0)
Eosinophils Absolute: 0.2 K/uL (ref 0.0–0.7)
Eosinophils Relative: 2.6 % (ref 0.0–5.0)
HCT: 39.1 % (ref 36.0–46.0)
Hemoglobin: 13.2 g/dL (ref 12.0–15.0)
Lymphocytes Relative: 29.8 % (ref 12.0–46.0)
Lymphs Abs: 1.8 K/uL (ref 0.7–4.0)
MCHC: 33.7 g/dL (ref 30.0–36.0)
MCV: 90.6 fl (ref 78.0–100.0)
Monocytes Absolute: 0.6 K/uL (ref 0.1–1.0)
Monocytes Relative: 10.4 % (ref 3.0–12.0)
Neutro Abs: 3.4 K/uL (ref 1.4–7.7)
Neutrophils Relative %: 56.4 % (ref 43.0–77.0)
Platelets: 298 K/uL (ref 150.0–400.0)
RBC: 4.32 Mil/uL (ref 3.87–5.11)
RDW: 13.2 % (ref 11.5–15.5)
WBC: 6.1 K/uL (ref 4.0–10.5)

## 2024-07-24 LAB — LIPID PANEL
Cholesterol: 176 mg/dL (ref 0–200)
HDL: 38.9 mg/dL — ABNORMAL LOW (ref >39.00)
LDL Cholesterol: 110 mg/dL — ABNORMAL HIGH (ref 0–99)
NonHDL: 136.73
Total CHOL/HDL Ratio: 5
Triglycerides: 132 mg/dL (ref 0.0–149.0)
VLDL: 26.4 mg/dL (ref 0.0–40.0)

## 2024-07-24 LAB — COMPREHENSIVE METABOLIC PANEL WITH GFR
ALT: 10 U/L (ref 0–35)
AST: 12 U/L (ref 0–37)
Albumin: 4.3 g/dL (ref 3.5–5.2)
Alkaline Phosphatase: 59 U/L (ref 39–117)
BUN: 16 mg/dL (ref 6–23)
CO2: 28 meq/L (ref 19–32)
Calcium: 8.9 mg/dL (ref 8.4–10.5)
Chloride: 105 meq/L (ref 96–112)
Creatinine, Ser: 0.74 mg/dL (ref 0.40–1.20)
GFR: 91.51 mL/min (ref >60.00)
Glucose, Bld: 91 mg/dL (ref 70–99)
Potassium: 3.8 meq/L (ref 3.5–5.1)
Sodium: 139 meq/L (ref 135–145)
Total Bilirubin: 0.4 mg/dL (ref 0.2–1.2)
Total Protein: 6.7 g/dL (ref 6.0–8.3)

## 2024-07-24 LAB — VITAMIN D 25 HYDROXY (VIT D DEFICIENCY, FRACTURES): VITD: 13.46 ng/mL — ABNORMAL LOW (ref 30.00–100.00)

## 2024-07-24 LAB — HEMOGLOBIN A1C: Hgb A1c MFr Bld: 6 % (ref 4.6–6.5)

## 2024-07-24 LAB — TSH: TSH: 1.93 u[IU]/mL (ref 0.35–5.50)

## 2024-07-24 MED ORDER — ESCITALOPRAM OXALATE 5 MG PO TABS
5.0000 mg | ORAL_TABLET | Freq: Every day | ORAL | 0 refills | Status: DC
  Filled 2024-07-24: qty 90, 90d supply, fill #0

## 2024-07-24 MED ORDER — DULOXETINE HCL 30 MG PO CPEP
30.0000 mg | ORAL_CAPSULE | Freq: Every day | ORAL | 0 refills | Status: DC
  Filled 2024-07-24: qty 14, 14d supply, fill #0

## 2024-07-24 MED ORDER — NA SULFATE-K SULFATE-MG SULF 17.5-3.13-1.6 GM/177ML PO SOLN
1.0000 | Freq: Once | ORAL | 0 refills | Status: AC
  Filled 2024-07-24: qty 354, 1d supply, fill #0

## 2024-07-24 NOTE — Progress Notes (Signed)
 Pt's name and DOB verified at the beginning of the pre-visit with 2 identifiers  Permission given to speak with  Pt denies any difficulty with ambulating,sitting, laying down or rolling side to side  Pt has no issues moving head neck or swallowing  No egg or soy allergy known to patient   No issues known to pt with past sedation  No FH of Malignant Hyperthermia  Pt is not on home 02   Pt is not on blood thinners   Pt denies issues with constipation   Pt is not on dialysis  Pt denise any abnormal heart rhythms   Pt denies any upcoming cardiac testing  Patient's chart reviewed by Norleen Schillings CNRA prior to pre-visit and patient appropriate for the [facility].  Pre-visit completed and red dot placed by patient's name on their procedure day (on provider's schedule).    Visit by phone  Pt states weight is 225 lb   Pt given  both [facility] main # and MD on call # prior to instructions.  Informed pt to come in at the time discussed and is shown on PV instructions.  Pt instructed to use Singlecare.com or GoodRx for a price reduction on prep  Instructed pt where to find PV instructions in My Chart  Instructed pt on all aspects of written instructions including med holds clothing to wear and foods to eat and not eat as well as after procedure legal restrictions and to call MD on call if needed.. Pt states understanding. Instructed pt to review instructions again prior to procedure and call main # given if has any questions or any issues. Pt states they will.

## 2024-07-24 NOTE — Assessment & Plan Note (Signed)
 PHQ/GAD reviewed. No SI/HI. Not getting any response from Cymbalta .  Sending in 2 weeks of Cymbalta  30 mg to wean down. When you finish, switch over to Lexapro  5 mg. After 2 weeks, if tolerating well, you can increase to Lexapro  10 mg daily. Follow-up with me in 2 months to reassess.

## 2024-07-24 NOTE — Assessment & Plan Note (Signed)
 No regular supplement. Labs today.

## 2024-07-24 NOTE — Progress Notes (Signed)
 Complete physical exam  Patient: Melissa Weber   DOB: 07/08/1971   53 y.o. Female  MRN: [id number]  Subjective:    Chief Complaint  Patient presents with   Annual Exam    Melissa Weber is a 53 y.o. female who presents today for a complete physical exam. She reports consuming a general diet. The patient does not participate in regular exercise at present. She generally feels fairly well. She reports sleeping poorly. She does not have additional problems to discuss today.   Currently lives with: husband Acute concerns or interim problems since last visit:  - anxiety is poorly controlled, sometimes bad enough causing chest discomfort, but denies and chest pain with exertion, radiation of pain, dyspnea. Discussed red flag symptoms to monitor for. She would like to try changing her anxiety medicine. Did well on Lexapro  in the past.   Vision concerns: no Dental concerns: no   ETOH use: no Nicotine use: no Recreational drugs/illegal substances: no   LMP: getting sporadic, last one was in August     Most recent fall risk assessment:    07/24/2024   12:24 PM  Fall Risk   Falls in the past year? 0  Number falls in past yr: 0  Injury with Fall? 0  Risk for fall due to : No Fall Risks  Follow up Falls evaluation completed     Most recent depression screenings:    07/24/2024   12:24 PM 07/24/2023   10:08 AM  PHQ 2/9 Scores  PHQ - 2 Score 0 2  PHQ- 9 Score 9 10       07/24/2024   12:25 PM  GAD 7 : Generalized Anxiety Score  Nervous, Anxious, on Edge 3  Control/stop worrying 3  Worry too much - different things 3  Trouble relaxing 3  Restless 0  Easily annoyed or irritable 2  Afraid - awful might happen 0  Total GAD 7 Score 14  Anxiety Difficulty Somewhat difficult            Patient Care Team: Cyndi Shaver, PA-C (Inactive) as PCP - General (Physician Assistant) Leva Rush, MD as Consulting Physician (Obstetrics and Gynecology)   Outpatient  Medications Prior to Visit  Medication Sig   Cholecalciferol 50 MCG (2000 UT) TABS 1 tablet Orally Once a day; Duration: 30 day(s)   ibuprofen  (ADVIL ,MOTRIN ) 200 MG tablet Take 400 mg by mouth every 6 (six) hours as needed for moderate pain.   [DISCONTINUED] DULoxetine  (CYMBALTA ) 60 MG capsule Take 1 capsule (60 mg total) by mouth daily.   Na Sulfate-K Sulfate-Mg Sulfate concentrate (SUPREP) 17.5-3.13-1.6 GM/177ML SOLN Take 1 kit (354 mLs total) by mouth once for 1 dose. (Patient not taking: Reported on 07/24/2024)   No facility-administered medications prior to visit.    ROS All review of systems negative except what is listed in the HPI        Objective:     BP 129/64   Pulse 79   Ht 5' 2.5 (1.588 m)   Wt 220 lb (99.8 kg)   SpO2 99%   BMI 39.60 kg/m    Physical Exam Vitals reviewed.  Constitutional:      General: She is not in acute distress.    Appearance: Normal appearance. She is obese. She is not ill-appearing.  HENT:     Head: Normocephalic and atraumatic.     Right Ear: Tympanic membrane normal.     Left Ear: Tympanic membrane normal.     Nose:  Nose normal.     Mouth/Throat:     Mouth: Mucous membranes are moist.     Pharynx: Oropharynx is clear.  Eyes:     Extraocular Movements: Extraocular movements intact.     Conjunctiva/sclera: Conjunctivae normal.     Pupils: Pupils are equal, round, and reactive to light.  Cardiovascular:     Rate and Rhythm: Normal rate and regular rhythm.     Pulses: Normal pulses.     Heart sounds: Normal heart sounds.  Pulmonary:     Effort: Pulmonary effort is normal.     Breath sounds: Normal breath sounds.  Abdominal:     General: Abdomen is flat. Bowel sounds are normal. There is no distension.     Palpations: Abdomen is soft. There is no mass.     Tenderness: There is no abdominal tenderness. There is no right CVA tenderness, left CVA tenderness, guarding or rebound.  Genitourinary:    Comments: Deferred  exam Musculoskeletal:        General: Normal range of motion.     Cervical back: Normal range of motion and neck supple. No tenderness.     Right lower leg: No edema.     Left lower leg: No edema.  Lymphadenopathy:     Cervical: No cervical adenopathy.  Skin:    General: Skin is warm and dry.     Capillary Refill: Capillary refill takes less than 2 seconds.  Neurological:     General: No focal deficit present.     Mental Status: She is alert and oriented to person, place, and time. Mental status is at baseline.  Psychiatric:        Mood and Affect: Mood normal.        Behavior: Behavior normal.        Thought Content: Thought content normal.        Judgment: Judgment normal.         No results found for any visits on 07/24/24.     Assessment & Plan:    Routine Health Maintenance and Physical Exam Discussed health promotion and safety including diet and exercise recommendations, dental health, and injury prevention. Tobacco cessation if applicable. Seat belts, sunscreen, smoke detectors, etc.    Immunization History  Administered Date(s) Administered   PFIZER(Purple Top)SARS-COV-2 Vaccination 02/05/2020, 09/13/2020   Tdap 03/30/2009, 07/15/2019   Zoster Recombinant(Shingrix ) 11/20/2022, 04/05/2023    Health Maintenance  Topic Date Due   HIV Screening  Never done   Hepatitis C Screening  Never done   Mammogram  12/07/2023   Influenza Vaccine  01/26/2025 (Originally 05/29/2024)   COVID-19 Vaccine (3 - 2025-26 season) 07/23/2025 (Originally 06/29/2024)   Pneumococcal Vaccine: 50+ Years (1 of 1 - PCV) 07/24/2025 (Originally 10/23/2019)   Hepatitis B Vaccines 19-59 Average Risk (1 of 3 - 19+ 3-dose series) 07/24/2025 (Originally 10/22/1988)   Colonoscopy  07/24/2025 (Originally 02/25/2024)   Cervical Cancer Screening (HPV/Pap Cotest)  09/28/2024   DTaP/Tdap/Td (3 - Td or Tdap) 07/14/2029   Zoster Vaccines- Shingrix   Completed   HPV VACCINES  Aged Out   Meningococcal B  Vaccine  Aged Out        Problem List Items Addressed This Visit       Active Problems   Anxiety and depression   PHQ/GAD reviewed. No SI/HI. Not getting any response from Cymbalta .  Sending in 2 weeks of Cymbalta  30 mg to wean down. When you finish, switch over to Lexapro  5 mg. After 2 weeks, if tolerating  well, you can increase to Lexapro  10 mg daily. Follow-up with me in 2 months to reassess.        Relevant Medications   escitalopram  (LEXAPRO ) 5 MG tablet   DULoxetine  (CYMBALTA ) 30 MG capsule   Vitamin D  deficiency   No regular supplement. Labs today.       Relevant Orders   Vitamin D  (25 hydroxy)   Other Visit Diagnoses       Encounter for medical examination to establish care    -  Primary     Annual physical exam       Relevant Orders   CBC with Differential/Platelet   Comprehensive metabolic panel with GFR   Lipid panel   TSH     Prediabetes       Relevant Orders   Hemoglobin A1c          PATIENT COUNSELING:     Recommend that most people either abstain from alcohol or drink within safe limits (<=14/week and <=4 drinks/occasion for males, <=7/weeks and <= 3 drinks/occasion for females) and that the risk for alcohol disorders and other health effects rises proportionally with the number of drinks per week and how often a drinker exceeds daily limits.   Diet: Recommend to adjust caloric intake to maintain or achieve ideal body weight, to reduce intake of dietary saturated fat and total fat, to limit sodium intake by avoiding high sodium foods and not adding table salt, and to maintain adequate dietary potassium and calcium preferably from fresh fruits, vegetables, and low-fat dairy products.   Emphasized the importance of regular exercise.  Injury prevention: Recommend seatbelts, safety helmets, smoke detector, etc..   Dental health: Recommend regular tooth brushing, flossing, and dental visits.       Return in about 2 months (around 09/23/2024)  for mood follow-up.     Waddell KATHEE Mon, NP

## 2024-07-24 NOTE — Patient Instructions (Signed)
 Changing from Cymbalta  to Lexapro   Sending in 2 weeks of Cymbalta  30 mg to wean down. When you finish, switch over to Lexapro  5 mg. After 2 weeks, if tolerating well, you can increase to Lexapro  10 mg daily. Follow-up with me in 2 months to reassess.

## 2024-07-27 ENCOUNTER — Encounter: Payer: Self-pay | Admitting: Family Medicine

## 2024-07-28 ENCOUNTER — Other Ambulatory Visit (HOSPITAL_BASED_OUTPATIENT_CLINIC_OR_DEPARTMENT_OTHER): Payer: Self-pay

## 2024-07-28 ENCOUNTER — Ambulatory Visit: Payer: Self-pay | Admitting: Family Medicine

## 2024-07-28 DIAGNOSIS — E559 Vitamin D deficiency, unspecified: Secondary | ICD-10-CM

## 2024-07-28 MED ORDER — VITAMIN D (ERGOCALCIFEROL) 1.25 MG (50000 UNIT) PO CAPS
50000.0000 [IU] | ORAL_CAPSULE | ORAL | 0 refills | Status: AC
  Filled 2024-07-28: qty 12, 84d supply, fill #0

## 2024-08-03 ENCOUNTER — Encounter: Payer: Self-pay | Admitting: Gastroenterology

## 2024-08-03 ENCOUNTER — Ambulatory Visit (AMBULATORY_SURGERY_CENTER): Admitting: Gastroenterology

## 2024-08-03 VITALS — BP 132/75 | HR 78 | Temp 97.4°F | Resp 16 | Ht 62.5 in | Wt 225.0 lb

## 2024-08-03 DIAGNOSIS — K644 Residual hemorrhoidal skin tags: Secondary | ICD-10-CM

## 2024-08-03 DIAGNOSIS — Z1211 Encounter for screening for malignant neoplasm of colon: Secondary | ICD-10-CM | POA: Diagnosis not present

## 2024-08-03 DIAGNOSIS — K648 Other hemorrhoids: Secondary | ICD-10-CM | POA: Diagnosis not present

## 2024-08-03 DIAGNOSIS — F419 Anxiety disorder, unspecified: Secondary | ICD-10-CM | POA: Diagnosis not present

## 2024-08-03 DIAGNOSIS — Z860101 Personal history of adenomatous and serrated colon polyps: Secondary | ICD-10-CM | POA: Diagnosis not present

## 2024-08-03 DIAGNOSIS — Z860100 Personal history of colon polyps, unspecified: Secondary | ICD-10-CM

## 2024-08-03 DIAGNOSIS — F32A Depression, unspecified: Secondary | ICD-10-CM | POA: Diagnosis not present

## 2024-08-03 DIAGNOSIS — D125 Benign neoplasm of sigmoid colon: Secondary | ICD-10-CM

## 2024-08-03 DIAGNOSIS — K6389 Other specified diseases of intestine: Secondary | ICD-10-CM | POA: Diagnosis not present

## 2024-08-03 MED ORDER — SODIUM CHLORIDE 0.9 % IV SOLN: 500.0000 mL | Freq: Once | INTRAVENOUS | Status: DC

## 2024-08-03 NOTE — Progress Notes (Signed)
 Pt's states no medical or surgical changes since previsit or office visit.

## 2024-08-03 NOTE — Progress Notes (Signed)
 Report given to PACU, vss

## 2024-08-03 NOTE — Patient Instructions (Signed)

## 2024-08-03 NOTE — Op Note (Signed)
 [facility] Patient Name: Melissa Weber Procedure Date: 08/03/2024 8:39 AM MRN: [id number] Endoscopist: Gustav ALONSO Mcgee , MD, [id number] Age: 53 Referring MD:  Date of Birth: July 20, 1971 Gender: Female Account #: [id number] Procedure:                Colonoscopy Indications:              High risk colon cancer surveillance: Personal                            history of adenoma (10 mm or greater in size) Medicines:                Monitored Anesthesia Care Procedure:                Pre-Anesthesia Assessment:                           - Prior to the procedure, a History and Physical                            was performed, and patient medications and                            allergies were reviewed. The patient's tolerance of                            previous anesthesia was also reviewed. The risks                            and benefits of the procedure and the sedation                            options and risks were discussed with the patient.                            All questions were answered, and informed consent                            was obtained. Prior Anticoagulants: The patient has                            taken no anticoagulant or antiplatelet agents. ASA                            Grade Assessment: II - A patient with mild systemic                            disease. After reviewing the risks and benefits,                            the patient was deemed in satisfactory condition to                            undergo the procedure.  After obtaining informed consent, the colonoscope                            was passed under direct vision. Throughout the                            procedure, the patient's blood pressure, pulse, and                            oxygen saturations were monitored continuously. The                            Olympus Scope SN [id number] was introduced through the                            anus  and advanced to the the cecum, identified by                            appendiceal orifice and ileocecal valve. The                            colonoscopy was performed without difficulty. The                            patient tolerated the procedure well. The quality                            of the bowel preparation was good. The ileocecal                            valve, appendiceal orifice, and rectum were                            photographed. Scope In: 8:53:49 AM Scope Out: 9:11:57 AM Scope Withdrawal Time: 0 hours 14 minutes 4 seconds  Total Procedure Duration: 0 hours 18 minutes 8 seconds  Findings:                 The perianal and digital rectal examinations were                            normal.                           A 3 mm polyp was found in the sigmoid colon. The                            polyp was pedunculated. The polyp was removed with                            a cold snare. Resection and retrieval were complete.                           Non-bleeding external and internal hemorrhoids were  found during retroflexion. The hemorrhoids were                            medium-sized. Complications:            No immediate complications. Estimated Blood Loss:     Estimated blood loss was minimal. Impression:               - One 3 mm polyp in the sigmoid colon, removed with                            a cold snare. Resected and retrieved.                           - Non-bleeding external and internal hemorrhoids. Recommendation:           - Patient has a contact number available for                            emergencies. The signs and symptoms of potential                            delayed complications were discussed with the                            patient. Return to normal activities tomorrow.                            Written discharge instructions were provided to the                            patient.                           -  Resume previous diet.                           - Continue present medications.                           - Await pathology results.                           - Repeat colonoscopy in 5 years for surveillance. Royann Wildasin V. Mamadou Breon, MD 08/03/2024 9:16:32 AM This report has been signed electronically.

## 2024-08-03 NOTE — Progress Notes (Signed)
 [facility] History and Physical   Primary Care Physician:  Almarie Waddell NOVAK, NP   Reason for Procedure:  History of adenomatous colon polyps  Plan:    Surveillance colonoscopy with possible interventions as needed     HPI: Melissa Weber is a very pleasant 53 y.o. female here for surveillance colonoscopy. Denies any nausea, vomiting, abdominal pain, melena or bright red blood per rectum  The risks and benefits as well as alternatives of endoscopic procedure(s) have been discussed and reviewed. All questions answered. The patient agrees to proceed.    Past Medical History:  Diagnosis Date   Anxiety    Depression    Vitamin D  deficiency     Past Surgical History:  Procedure Laterality Date   COLONOSCOPY     NO PAST SURGERIES      Prior to Admission medications   Medication Sig Start Date End Date Taking? Authorizing Provider  Cholecalciferol 50 MCG (2000 UT) TABS 1 tablet Orally Once a day; Duration: 30 day(s)   Yes [provider]  DULoxetine  (CYMBALTA ) 30 MG capsule Take 1 capsule (30 mg total) by mouth daily. 07/24/24  Yes Almarie Waddell NOVAK, NP  escitalopram  (LEXAPRO ) 5 MG tablet Take 1 tablet (5 mg total) by mouth daily. 07/24/24   Almarie Waddell NOVAK, NP  ibuprofen  (ADVIL ,MOTRIN ) 200 MG tablet Take 400 mg by mouth every 6 (six) hours as needed for moderate pain.    [provider]  Vitamin D , Ergocalciferol , (DRISDOL) 1.25 MG (50000 UNIT) CAPS capsule Take 1 capsule (50,000 Units total) by mouth every 7 (seven) days. 07/28/24   Almarie Waddell NOVAK, NP    Current Outpatient Medications  Medication Sig Dispense Refill   Cholecalciferol 50 MCG (2000 UT) TABS 1 tablet Orally Once a day; Duration: 30 day(s)     DULoxetine  (CYMBALTA ) 30 MG capsule Take 1 capsule (30 mg total) by mouth daily. 14 capsule 0   escitalopram  (LEXAPRO ) 5 MG tablet Take 1 tablet (5 mg total) by mouth daily. 90 tablet 0   ibuprofen  (ADVIL ,MOTRIN ) 200 MG tablet Take 400 mg by mouth  every 6 (six) hours as needed for moderate pain.     Vitamin D , Ergocalciferol , (DRISDOL) 1.25 MG (50000 UNIT) CAPS capsule Take 1 capsule (50,000 Units total) by mouth every 7 (seven) days. 12 capsule 0   Current Facility-Administered Medications  Medication Dose Route Frequency Provider Last Rate Last Admin   0.9 %  sodium chloride  infusion  500 mL Intravenous Once Addisyn Leclaire V, MD        Allergies as of 08/03/2024   (No Known Allergies)    Family History  Problem Relation Age of Onset   Colon polyps Father    Colon cancer Neg Hx    Esophageal cancer Neg Hx    Rectal cancer Neg Hx    Stomach cancer Neg Hx     Social History   Socioeconomic History   Marital status: Married    Spouse name: Not on file   Number of children: Not on file   Years of education: Not on file   Highest education level: Associate degree: academic program  Occupational History   Not on file  Tobacco Use   Smoking status: Never   Smokeless tobacco: Never  Vaping Use   Vaping status: Never Used  Substance and Sexual Activity   Alcohol use: No   Drug use: No   Sexual activity: Yes    Comment: Husbands vasectomy  Other Topics Concern  Not on file  Social History Narrative   Not on file   Social Drivers of Health   Financial Resource Strain: Low Risk  (04/29/2024)   Overall Financial Resource Strain (CARDIA)    Difficulty of Paying Living Expenses: Not hard at all  Food Insecurity: No Food Insecurity (04/29/2024)   Hunger Vital Sign    Worried About Running Out of Food in the Last Year: Never true    Ran Out of Food in the Last Year: Never true  Transportation Needs: No Transportation Needs (04/29/2024)   PRAPARE - Administrator, Civil Service (Medical): No    Lack of Transportation (Non-Medical): No  Physical Activity: Inactive (04/29/2024)   Exercise Vital Sign    Days of Exercise per Week: 0 days    Minutes of Exercise per Session: Not on file  Stress: Stress Concern  Present (04/29/2024)   [employer] of Occupational Health - Occupational Stress Questionnaire    Feeling of Stress: To some extent  Social Connections: Socially Integrated (04/29/2024)   Social Connection and Isolation Panel    Frequency of Communication with Friends and Family: More than three times a week    Frequency of Social Gatherings with Friends and Family: More than three times a week    Attends Religious Services: More than 4 times per year    Active Member of [employer] or Organizations: Yes    Attends Engineer, structural: More than 4 times per year    Marital Status: Married  Catering manager Violence: Not on file    Review of Systems:  All other review of systems negative except as mentioned in the HPI.  Physical Exam: Vital signs in last 24 hours: BP 130/80   Pulse 84   Temp (!) 97.4 F (36.3 C) (Skin)   Ht 5' 2.5 (1.588 m)   Wt 225 lb (102.1 kg)   SpO2 97%   BMI 40.50 kg/m  General:   Alert, NAD Lungs:  Clear .   Heart:  Regular rate and rhythm Abdomen:  Soft, nontender and nondistended. Neuro/Psych:  Alert and cooperative. Normal mood and affect. A and O x 3  Reviewed labs, radiology imaging, old records and pertinent past GI work up  Patient is appropriate for planned procedure(s) and anesthesia in an ambulatory setting   K. Veena Arletha Marschke , MD [phone]

## 2024-08-03 NOTE — Progress Notes (Signed)
 Called to room to assist during endoscopic procedure.  Patient ID and intended procedure confirmed with present staff. Received instructions for my participation in the procedure from the performing physician.

## 2024-08-04 ENCOUNTER — Telehealth: Payer: Self-pay | Admitting: *Deleted

## 2024-08-04 NOTE — Telephone Encounter (Signed)
 Left message on f/u call

## 2024-08-06 LAB — SURGICAL PATHOLOGY

## 2024-08-07 ENCOUNTER — Ambulatory Visit: Payer: Self-pay | Admitting: Gastroenterology

## 2024-08-17 DIAGNOSIS — Z13820 Encounter for screening for osteoporosis: Secondary | ICD-10-CM | POA: Diagnosis not present

## 2024-08-27 ENCOUNTER — Other Ambulatory Visit: Payer: Self-pay | Admitting: Obstetrics and Gynecology

## 2024-08-27 DIAGNOSIS — Z8249 Family history of ischemic heart disease and other diseases of the circulatory system: Secondary | ICD-10-CM

## 2024-09-16 ENCOUNTER — Ambulatory Visit
Admission: RE | Admit: 2024-09-16 | Discharge: 2024-09-16 | Disposition: A | Source: Ambulatory Visit | Attending: Obstetrics and Gynecology | Admitting: Obstetrics and Gynecology

## 2024-09-16 DIAGNOSIS — Z8249 Family history of ischemic heart disease and other diseases of the circulatory system: Secondary | ICD-10-CM

## 2024-09-23 ENCOUNTER — Other Ambulatory Visit (HOSPITAL_BASED_OUTPATIENT_CLINIC_OR_DEPARTMENT_OTHER): Payer: Self-pay

## 2024-09-23 ENCOUNTER — Ambulatory Visit: Admitting: Family Medicine

## 2024-09-23 VITALS — BP 132/78 | HR 75 | Ht 62.5 in | Wt 228.0 lb

## 2024-09-23 DIAGNOSIS — G4739 Other sleep apnea: Secondary | ICD-10-CM | POA: Diagnosis not present

## 2024-09-23 DIAGNOSIS — F419 Anxiety disorder, unspecified: Secondary | ICD-10-CM | POA: Diagnosis not present

## 2024-09-23 DIAGNOSIS — F32A Depression, unspecified: Secondary | ICD-10-CM | POA: Diagnosis not present

## 2024-09-23 MED ORDER — ESCITALOPRAM OXALATE 10 MG PO TABS
10.0000 mg | ORAL_TABLET | Freq: Every day | ORAL | 1 refills | Status: AC
  Filled 2024-09-23: qty 90, 90d supply, fill #0

## 2024-09-23 NOTE — Progress Notes (Signed)
 Established Patient Office Visit  Subjective:  Patient ID: Melissa Weber, female    DOB: 1971-03-24  Age: 53 y.o. MRN: [id number]  CC:  Chief Complaint  Patient presents with   Medical Management of Chronic Issues      HPI Melissa Weber is here for routine follow-up.   Discussed the use of AI scribe software for clinical note transcription with the patient, who gave verbal consent to proceed.  History of Present Illness Melissa Weber is a 53 year old female who presents for follow-up on her mental health and weight management.  She has experienced significant improvement in her anxiety symptoms since starting Lexapro , which she takes at night. Her mood has improved, and she is sleeping better. Her family has observed a positive change in her demeanor. She was previously on Cymbalta  but switched to Lexapro , which she feels has been helpful. No chest pain has been experienced since starting Lexapro .  She wants to lose weight and discusses her emotional relationship with food. She is interested in starting a GLP-1 medication for weight loss but is concerned about insurance coverage. She is exploring self-pay options and is considering consulting a nutritionist.  She is currently taking ergocalciferol  1.25 mg weekly for vitamin D  deficiency. Her vitamin D  levels were low - recheck due in December.  She has not had a sleep study but reports poor sleep quality, feeling exhausted despite going to bed, and loud snoring as noted by her daughter. She is considering a sleep study to evaluate for potential sleep apnea, which may also impact her weight management options.  Her A1c was previously checked and indicated prediabetes. It is too early for a recheck, but her thyroid function and other labs were normal.  No family history of medullary thyroid cancer, multiple endocrine neoplasia, or pancreatitis. She has not been told she stops breathing in her sleep.       Anxiety/Depression: - Treatment: Lexapro  10 mg daily. - Has tried Cymbalta  without efficacy. - Medication side effects: none - SI/HI: no - Update: Positive change since starting Lexapro ! Would like to continue.   Wt Readings from Last 3 Encounters:  09/23/24 228 lb (103.4 kg)  08/03/24 225 lb (102.1 kg)  07/24/24 220 lb (99.8 kg)     Vitamin D  deficiency: - management: currently on high dose supplement  - symptoms: none         09/23/2024   10:55 AM 07/24/2024   12:24 PM 07/24/2023   10:08 AM  PHQ9 SCORE ONLY  PHQ-9 Total Score 3 9 10       Data saved with a previous flowsheet row definition      09/23/2024   10:56 AM 07/24/2024   12:25 PM  GAD 7 : Generalized Anxiety Score  Nervous, Anxious, on Edge 1 3  Control/stop worrying 1 3  Worry too much - different things 0 3  Trouble relaxing 0 3  Restless 0 0  Easily annoyed or irritable 0 2  Afraid - awful might happen 0 0  Total GAD 7 Score 2 14  Anxiety Difficulty Not difficult at all Somewhat difficult       Past Medical History:  Diagnosis Date   Anxiety    Depression    Vitamin D  deficiency     Past Surgical History:  Procedure Laterality Date   COLONOSCOPY     NO PAST SURGERIES      Family History  Problem Relation Age of Onset   Colon polyps Father  Colon cancer Neg Hx    Esophageal cancer Neg Hx    Rectal cancer Neg Hx    Stomach cancer Neg Hx     Social History   Socioeconomic History   Marital status: Married    Spouse name: Not on file   Number of children: Not on file   Years of education: Not on file   Highest education level: Associate degree: academic program  Occupational History   Not on file  Tobacco Use   Smoking status: Never   Smokeless tobacco: Never  Vaping Use   Vaping status: Never Used  Substance and Sexual Activity   Alcohol use: No   Drug use: No   Sexual activity: Yes    Comment: Husbands vasectomy  Other Topics Concern   Not on file  Social  History Narrative   Not on file   Social Drivers of Health   Financial Resource Strain: Low Risk  (09/23/2024)   Overall Financial Resource Strain (CARDIA)    Difficulty of Paying Living Expenses: Not hard at all  Food Insecurity: No Food Insecurity (09/23/2024)   Hunger Vital Sign    Worried About Running Out of Food in the Last Year: Never true    Ran Out of Food in the Last Year: Never true  Transportation Needs: No Transportation Needs (09/23/2024)   PRAPARE - Administrator, Civil Service (Medical): No    Lack of Transportation (Non-Medical): No  Physical Activity: Inactive (09/23/2024)   Exercise Vital Sign    Days of Exercise per Week: 0 days    Minutes of Exercise per Session: Not on file  Stress: No Stress Concern Present (09/23/2024)   [employer] of Occupational Health - Occupational Stress Questionnaire    Feeling of Stress: Only a little  Social Connections: Socially Integrated (09/23/2024)   Social Connection and Isolation Panel    Frequency of Communication with Friends and Family: More than three times a week    Frequency of Social Gatherings with Friends and Family: More than three times a week    Attends Religious Services: More than 4 times per year    Active Member of [employer] or Organizations: Yes    Attends Engineer, Structural: More than 4 times per year    Marital Status: Married  Catering Manager Violence: Not on file    ROS All ROS negative except what is listed in the HPI.   Objective:   Today's Vitals: BP 132/78   Pulse 75   Ht 5' 2.5 (1.588 m)   Wt 228 lb (103.4 kg)   SpO2 95%   BMI 41.04 kg/m   Physical Exam Vitals reviewed.  Constitutional:      Appearance: Normal appearance. She is obese.  Cardiovascular:     Rate and Rhythm: Normal rate and regular rhythm.     Heart sounds: Normal heart sounds.  Pulmonary:     Effort: Pulmonary effort is normal.     Breath sounds: Normal breath sounds.  Skin:     General: Skin is warm and dry.  Neurological:     Mental Status: She is alert and oriented to person, place, and time.  Psychiatric:        Mood and Affect: Mood normal.        Behavior: Behavior normal.        Thought Content: Thought content normal.        Judgment: Judgment normal.       Assessment & Plan:  Problem List Items Addressed This Visit       Active Problems   Anxiety and depression - Primary   Well-managed with Lexapro . PHQ-9 improved to 3, GAD-7 to 2. No chest pain, indicating reduced anxiety. NO SI/HI.  - Continue Lexapro  10 mg daily. - Scheduled follow-up in three months.      Relevant Medications   escitalopram  (LEXAPRO ) 10 MG tablet   Morbid obesity (HCC)   Interested in GLP-1 therapy. Discussed self-pay options and potential side effects. Prefers slower weight loss, considering Zepbound. - Discussed self-pay options for GLP-1 - Discussed potential side effects of GLP-1 therapy. - We will wait for sleep study to see if she would get any insurance coverage.  - Continue healthy diet, portion control, regular exercise.       Other Visit Diagnoses       Sleep apnea-like behavior     Loud snoring and daytime fatigue suggest sleep apnea. No prior sleep study. Diagnosis may aid GLP-1 coverage. - Referred to pulmonology for sleep study. - Discussed potential insurance coverage for GLP-1 if diagnosed.   Relevant Orders   Ambulatory referral to Pulmonology       Follow-up: Return in about 3 months (around 12/24/2024) for chronic disease management.   Waddell FURY Almarie, DNP, FNP-C  I,Emily Lagle,acting as a neurosurgeon for Waddell KATHEE Almarie, NP.,have documented all relevant documentation on the behalf of Waddell KATHEE Almarie, NP.  I, Waddell KATHEE Almarie, NP, have reviewed all documentation for this visit. The documentation on 09/23/2024 for the exam, diagnosis, procedures, and orders are all accurate and complete.

## 2024-09-23 NOTE — Assessment & Plan Note (Signed)
 Well-managed with Lexapro . PHQ-9 improved to 3, GAD-7 to 2. No chest pain, indicating reduced anxiety. NO SI/HI.  - Continue Lexapro  10 mg daily. - Scheduled follow-up in three months.

## 2024-09-23 NOTE — Assessment & Plan Note (Signed)
 Interested in GLP-1 therapy. Discussed self-pay options and potential side effects. Prefers slower weight loss, considering Zepbound. - Discussed self-pay options for GLP-1 - Discussed potential side effects of GLP-1 therapy. - We will wait for sleep study to see if she would get any insurance coverage.  - Continue healthy diet, portion control, regular exercise.

## 2024-10-06 ENCOUNTER — Ambulatory Visit: Admitting: Sleep Medicine

## 2024-10-14 ENCOUNTER — Ambulatory Visit: Admitting: Sleep Medicine

## 2024-10-14 ENCOUNTER — Encounter: Payer: Self-pay | Admitting: Sleep Medicine

## 2024-10-14 VITALS — BP 118/80 | HR 71 | Temp 98.0°F | Ht 66.0 in | Wt 228.2 lb

## 2024-10-14 DIAGNOSIS — G4710 Hypersomnia, unspecified: Secondary | ICD-10-CM

## 2024-10-14 DIAGNOSIS — R0683 Snoring: Secondary | ICD-10-CM | POA: Diagnosis not present

## 2024-10-14 DIAGNOSIS — Z6836 Body mass index (BMI) 36.0-36.9, adult: Secondary | ICD-10-CM

## 2024-10-14 DIAGNOSIS — G4733 Obstructive sleep apnea (adult) (pediatric): Secondary | ICD-10-CM

## 2024-10-14 DIAGNOSIS — E669 Obesity, unspecified: Secondary | ICD-10-CM | POA: Diagnosis not present

## 2024-10-14 NOTE — Patient Instructions (Signed)
 SABRA

## 2024-10-14 NOTE — Progress Notes (Signed)
 Name:Melissa Weber MRN: [id number] DOB: April 25, 1971   CHIEF COMPLAINT:  EXCESSIVE DAYTIME SLEEPINESS   HISTORY OF PRESENT ILLNESS: Melissa Weber is a 53 y.o. w/ a h/o obesity, anxiety and depression who presents for c/o loud snoring and excessive daytime sleepiness which has been present for several years. Reports nocturnal awakenings due to unclear reasons and has difficulty falling back to sleep. Reports significant weight changes. Admits to dry mouth, night sweats and morning headaches. Denies RLS symptoms, dream enactment, cataplexy, hypnagogic or hypnapompic hallucinations. Reports a family history of sleep apnea. Denies drowsy driving. Drinks 2 cups of coffee daily, denies tobacco, alcohol or illicit drug use.   Bedtime 10-11 pm Sleep onset 1 hour Rise time 6 am   EPWORTH SLEEP SCORE 7    10/14/2024    9:00 AM  Results of the Epworth flowsheet  Sitting and reading 2  Watching TV 0  Sitting, inactive in a public place (e.g. a theatre or a meeting) 0  As a passenger in a car for an hour without a break 2  Lying down to rest in the afternoon when circumstances permit 3  Sitting and talking to someone 0  Sitting quietly after a lunch without alcohol 0  In a car, while stopped for a few minutes in traffic 0  Total score 7    PAST MEDICAL HISTORY :   has a past medical history of Anxiety, Depression, and Vitamin D  deficiency.  has a past surgical history that includes No past surgeries and Colonoscopy. Prior to Admission medications  Medication Sig Start Date End Date Taking? Authorizing Provider  Cholecalciferol 50 MCG (2000 UT) TABS 1 tablet Orally Once a day; Duration: 30 day(s)   Yes [provider]  escitalopram  (LEXAPRO ) 10 MG tablet Take 1 tablet (10 mg total) by mouth daily. 09/23/24  Yes Almarie Waddell NOVAK, NP  ibuprofen  (ADVIL ,MOTRIN ) 200 MG tablet Take 400 mg by mouth every 6 (six) hours as needed for moderate pain.   Yes [provider]   Vitamin D , Ergocalciferol , (DRISDOL ) 1.25 MG (50000 UNIT) CAPS capsule Take 1 capsule (50,000 Units total) by mouth every 7 (seven) days. 07/28/24  Yes Almarie Waddell NOVAK, NP   Allergies[1]  FAMILY HISTORY:  family history includes Asthma in her mother; Cancer in her mother; Colon polyps in her father; Diabetes in her mother; Hearing loss in her mother; Heart disease in her father; Hypertension in her father. SOCIAL HISTORY:  reports that she has never smoked. She has never used smokeless tobacco. She reports that she does not drink alcohol and does not use drugs.   Review of Systems:  Gen:  Denies  fever, sweats, chills weight loss  HEENT: Denies blurred vision, double vision, ear pain, eye pain, hearing loss, nose bleeds, sore throat Cardiac:  No dizziness, chest pain or heaviness, chest tightness,edema, No JVD Resp:   No cough, -sputum production, -shortness of breath,-wheezing, -hemoptysis,  Gi: Denies swallowing difficulty, stomach pain, nausea or vomiting, diarrhea, constipation, bowel incontinence Gu:  Denies bladder incontinence, burning urine Ext:   Denies Joint pain, stiffness or swelling Skin: Denies  skin rash, easy bruising or bleeding or hives Endoc:  Denies polyuria, polydipsia , polyphagia or weight change Psych:   Denies depression, insomnia or hallucinations  Other:  All other systems negative  VITAL SIGNS: BP 118/80   Pulse 71   Temp 98 F (36.7 C)   Ht 5' 6 (1.676 m)   Wt 228 lb 3.2  oz (103.5 kg)   SpO2 97%   BMI 36.83 kg/m    Physical Examination:   General Appearance: No distress  EYES PERRLA, EOM intact.   NECK Supple, No JVD Pulmonary: normal breath sounds, No wheezing.  CardiovascularNormal S1,S2.  No m/r/g.   Abdomen: Benign, Soft, non-tender. Skin:   warm, no rashes, no ecchymosis  Extremities: normal, no cyanosis, clubbing. Neuro:without focal findings,  speech normal  PSYCHIATRIC: Mood, affect within normal limits.   ASSESSMENT AND  PLAN  OSA I suspect that OSA is likely present due to clinical presentation. Discussed the consequences of untreated sleep apnea. Advised not to drive drowsy for safety of patient and others. Will complete further evaluation with a home sleep study and follow up to review results.    Obesity Counseled patient on diet and lifestyle modification.    MEDICATION ADJUSTMENTS/LABS AND TESTS ORDERED: Recommend Sleep Study   Patient  satisfied with Plan of action and management. All questions answered  Follow up to review HST results and treatment plan.   I spent a total of 31 minutes reviewing chart data, face-to-face evaluation with the patient, counseling and coordination of care as detailed above.    Hue Frick, M.D.  Sleep Medicine [facility]           [1] No Known Allergies

## 2024-10-31 ENCOUNTER — Encounter

## 2024-10-31 DIAGNOSIS — G4733 Obstructive sleep apnea (adult) (pediatric): Secondary | ICD-10-CM

## 2024-11-10 ENCOUNTER — Telehealth

## 2024-11-10 DIAGNOSIS — N760 Acute vaginitis: Secondary | ICD-10-CM

## 2024-11-10 DIAGNOSIS — B9689 Other specified bacterial agents as the cause of diseases classified elsewhere: Secondary | ICD-10-CM

## 2024-11-11 ENCOUNTER — Other Ambulatory Visit (HOSPITAL_BASED_OUTPATIENT_CLINIC_OR_DEPARTMENT_OTHER): Payer: Self-pay

## 2024-11-11 MED ORDER — METRONIDAZOLE 500 MG PO TABS
500.0000 mg | ORAL_TABLET | Freq: Two times a day (BID) | ORAL | 0 refills | Status: AC
  Filled 2024-11-11: qty 14, 7d supply, fill #0

## 2024-11-11 NOTE — Progress Notes (Signed)

## 2024-11-13 DIAGNOSIS — G4733 Obstructive sleep apnea (adult) (pediatric): Secondary | ICD-10-CM | POA: Diagnosis not present

## 2024-11-17 ENCOUNTER — Ambulatory Visit: Admitting: Family Medicine

## 2024-11-20 ENCOUNTER — Encounter: Payer: Self-pay | Admitting: Family Medicine

## 2024-11-20 DIAGNOSIS — G4733 Obstructive sleep apnea (adult) (pediatric): Secondary | ICD-10-CM

## 2024-11-25 ENCOUNTER — Encounter: Payer: Self-pay | Admitting: Sleep Medicine

## 2024-11-25 ENCOUNTER — Ambulatory Visit: Payer: Self-pay

## 2024-11-26 NOTE — Telephone Encounter (Signed)
 Sleep study results relayed in other encounter. Nfn.

## 2024-11-27 NOTE — Telephone Encounter (Signed)
 They called this moderate OSA. Please advise.

## 2024-12-01 NOTE — Telephone Encounter (Signed)
 Pended Zepbound  starter dosage.

## 2024-12-02 ENCOUNTER — Other Ambulatory Visit (HOSPITAL_BASED_OUTPATIENT_CLINIC_OR_DEPARTMENT_OTHER): Payer: Self-pay

## 2024-12-02 MED ORDER — ZEPBOUND 2.5 MG/0.5ML ~~LOC~~ SOAJ
2.5000 mg | SUBCUTANEOUS | 1 refills | Status: AC
  Filled 2024-12-02: qty 2, 28d supply, fill #0

## 2024-12-28 ENCOUNTER — Ambulatory Visit: Admitting: Family Medicine
